# Patient Record
Sex: Male | Born: 1950 | Race: Black or African American | Hispanic: No | Marital: Married | Smoking: Never smoker
Health system: Southern US, Community
[De-identification: ages and names within clinical notes are randomized; demographics above are authoritative.]

## PROBLEM LIST (undated history)

## (undated) DIAGNOSIS — I2510 Atherosclerotic heart disease of native coronary artery without angina pectoris: Secondary | ICD-10-CM

## (undated) DIAGNOSIS — I10 Essential (primary) hypertension: Secondary | ICD-10-CM

## (undated) DIAGNOSIS — E785 Hyperlipidemia, unspecified: Secondary | ICD-10-CM

## (undated) HISTORY — PX: HERNIA REPAIR: SHX51

## (undated) HISTORY — PX: CORONARY ANGIOPLASTY WITH STENT PLACEMENT: SHX49

## (undated) HISTORY — DX: Hyperlipidemia, unspecified: E78.5

## (undated) HISTORY — PX: CARDIAC CATHETERIZATION: SHX172

---
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
## 2016-07-19 ENCOUNTER — Ambulatory Visit
Admission: RE | Admit: 2016-07-19 | Discharge: 2016-07-19 | Disposition: A | Discharge status: Home / Self Care | Payer: 59 | Source: Ambulatory Visit | Attending: Otolaryngology | Admitting: Otolaryngology

## 2016-07-19 ENCOUNTER — Other Ambulatory Visit: Payer: Self-pay | Admitting: Otolaryngology

## 2016-07-19 DIAGNOSIS — L0201 Cutaneous abscess of face: Secondary | ICD-10-CM

## 2016-07-19 MED ORDER — IOPAMIDOL (ISOVUE-300) INJECTION 61%
75.0000 mL | Freq: Once | INTRAVENOUS | Status: AC | PRN
  Administered 2016-07-19: 75 mL via INTRAVENOUS

## 2017-02-14 ENCOUNTER — Ambulatory Visit
Admission: RE | Admit: 2017-02-14 | Discharge: 2017-02-14 | Disposition: A | Discharge status: Home / Self Care | Payer: 59 | Source: Ambulatory Visit | Attending: Family Medicine | Admitting: Family Medicine

## 2017-02-14 ENCOUNTER — Other Ambulatory Visit: Payer: Self-pay | Admitting: Family Medicine

## 2017-02-14 DIAGNOSIS — M7989 Other specified soft tissue disorders: Secondary | ICD-10-CM

## 2019-01-03 ENCOUNTER — Encounter: Payer: Self-pay | Admitting: Cardiology

## 2019-01-04 ENCOUNTER — Other Ambulatory Visit: Payer: Self-pay

## 2019-01-04 ENCOUNTER — Telehealth: Payer: Self-pay

## 2019-01-04 ENCOUNTER — Encounter: Payer: Self-pay | Admitting: Cardiology

## 2019-01-04 ENCOUNTER — Ambulatory Visit: Payer: 59 | Admitting: Cardiology

## 2019-01-04 VITALS — BP 140/94 | HR 62 | Ht 68.0 in | Wt 199.0 lb

## 2019-01-04 DIAGNOSIS — I10 Essential (primary) hypertension: Secondary | ICD-10-CM | POA: Diagnosis not present

## 2019-01-04 DIAGNOSIS — E785 Hyperlipidemia, unspecified: Secondary | ICD-10-CM

## 2019-01-04 DIAGNOSIS — R0789 Other chest pain: Secondary | ICD-10-CM | POA: Diagnosis not present

## 2019-01-04 DIAGNOSIS — R9431 Abnormal electrocardiogram [ECG] [EKG]: Secondary | ICD-10-CM | POA: Diagnosis not present

## 2019-01-04 HISTORY — DX: Essential (primary) hypertension: I10

## 2019-01-04 MED ORDER — LOSARTAN POTASSIUM 50 MG PO TABS: 100.0000 mg | ORAL_TABLET | Freq: Every day | ORAL | 3 refills | Status: DC

## 2019-01-04 MED ORDER — HYDROCHLOROTHIAZIDE 25 MG PO TABS: 25.0000 mg | ORAL_TABLET | Freq: Every day | ORAL | 0 refills | Status: DC

## 2019-01-04 MED ORDER — LOSARTAN POTASSIUM-HCTZ 100-25 MG PO TABS: 1.0000 | ORAL_TABLET | Freq: Every day | ORAL | 1 refills | Status: DC

## 2019-01-04 NOTE — Telephone Encounter (Signed)
Pt called and said that he does have a 90 days supply left of Losartan?

## 2019-01-04 NOTE — Progress Notes (Signed)
Primary Physician:  [name], MD   Patient ID: Bradley Parker, male    DOB: 07/16/1950, 68 y.o.   MRN: [id number]  Subjective:    Chief Complaint  Patient presents with  . Chest Pain  . New Patient (Initial Visit)    HPI: Bradley Parker  is a 68 y.o. male  with hypertension, preglaucoma, Vitamin D deficiency, referred to [place] for chest pain by Dr. Jacelyn Grip.  He has noticed over the last few months, he has had episodes where he feels chest tightness and anxious feeling in his chest. Not associated with exertion. No associated pain radiation or shortness of breath. Episodes can last for a few minutes. Last episode was one week ago. No palpitations or heart racing. No leg swelling. Does admit to snoring, but mostly wakes up feeling refreshed.   Reports that he he has had hypertension for many years and has been fairly well controlled. He has had mild hyperlipidemia. No history of diabetes.   No former tobacco use. Rare alcohol use. No illicit drug use. He is fairly active, he push mows his yard that he states is a decent size lot.   He is retired and lives with his wife. Has one daughter that lives in [place].   Past Medical History:  Diagnosis Date  . HTN (hypertension) 01/04/2019  . Hyperlipidemia     Past Surgical History:  Procedure Laterality Date  . HERNIA REPAIR      Social History   Socioeconomic History  . Marital status: Married    Spouse name: Not on file  . Number of children: 1  . Years of education: Not on file  . Highest education level: Not on file  Occupational History  . Not on file  Social Needs  . Financial resource strain: Not on file  . Food insecurity    Worry: Not on file    Inability: Not on file  . Transportation needs    Medical: Not on file    Non-medical: Not on file  Tobacco Use  . Smoking status: Never Smoker  . Smokeless tobacco: Never Used  Substance and Sexual Activity  . Alcohol use: Not Currently    Frequency: Never  . Drug  use: Never  . Sexual activity: Not on file  Lifestyle  . Physical activity    Days per week: Not on file    Minutes per session: Not on file  . Stress: Not on file  Relationships  . Social Herbalist on phone: Not on file    Gets together: Not on file    Attends religious service: Not on file    Active member of club or organization: Not on file    Attends meetings of clubs or organizations: Not on file    Relationship status: Not on file  . Intimate partner violence    Fear of current or ex partner: Not on file    Emotionally abused: Not on file    Physically abused: Not on file    Forced sexual activity: Not on file  Other Topics Concern  . Not on file  Social History Narrative  . Not on file    Review of Systems  Constitution: Negative for decreased appetite, malaise/fatigue, weight gain and weight loss.  Eyes: Negative for visual disturbance.  Cardiovascular: Positive for chest pain. Negative for claudication, dyspnea on exertion, leg swelling, orthopnea, palpitations and syncope.  Respiratory: Negative for hemoptysis and wheezing.   Endocrine: Negative for  cold intolerance and heat intolerance.  Hematologic/Lymphatic: Does not bruise/bleed easily.  Skin: Negative for nail changes.  Musculoskeletal: Negative for muscle weakness and myalgias.  Gastrointestinal: Negative for abdominal pain, change in bowel habit, nausea and vomiting.  Neurological: Negative for difficulty with concentration, dizziness, focal weakness and headaches.  Psychiatric/Behavioral: Negative for altered mental status and suicidal ideas.  All other systems reviewed and are negative.     Objective:  Blood pressure (!) 140/94, pulse 62, height '5\' 8"'  (1.727 m), weight 199 lb (90.3 kg), SpO2 97 %. Body mass index is 30.26 kg/m.    Physical Exam  Constitutional: He is oriented to person, place, and time. Vital signs are normal. He appears well-developed and well-nourished.  HENT:  Head:  Normocephalic and atraumatic.  Neck: Normal range of motion.  Cardiovascular: Normal rate, regular rhythm, normal heart sounds and intact distal pulses.  Pulses:      Dorsalis pedis pulses are 2+ on the right side and 2+ on the left side.       Posterior tibial pulses are 2+ on the right side and 2+ on the left side.  Ejection click at ULSB  Pulmonary/Chest: Effort normal and breath sounds normal. No accessory muscle usage. No respiratory distress.  Abdominal: Soft. Bowel sounds are normal.  Musculoskeletal: Normal range of motion.  Neurological: He is alert and oriented to person, place, and time.  Skin: Skin is warm and dry.  Vitals reviewed.  Radiology: No results found.  Laboratory examination:   12/25/2018: TSH 1.42.  Creatinine 1.3 EGFR 54/66, potassium 4.8, CMP otherwise normal.  Vitamin D 30.  Cholesterol 189, triglycerides 70, HDL 44, LDL 131.  No flowsheet data found. No flowsheet data found. Lipid Panel  No results found for: CHOL, TRIG, HDL, CHOLHDL, VLDL, LDLCALC, LDLDIRECT HEMOGLOBIN A1C No results found for: HGBA1C, MPG TSH No results for input(s): TSH in the last 8760 hours.  PRN Meds:. Medications Discontinued During This Encounter  Medication Reason  . loratadine (CLARITIN) 10 MG tablet Error   Current Meds  Medication Sig  . aspirin EC 81 MG tablet Take 1 tablet by mouth daily.  Marland Kitchen dutasteride (AVODART) 0.5 MG capsule Take 1 capsule by mouth 3 (three) times daily.  Marland Kitchen Fexofenadine HCl (ALLERGY 24-HR PO) Take by mouth daily.  Marland Kitchen latanoprost (XALATAN) 0.005 % ophthalmic solution Place 1 drop into both eyes at bedtime.  Marland Kitchen losartan (COZAAR) 50 MG tablet Take 1 tablet by mouth daily.  . timolol (TIMOPTIC) 0.5 % ophthalmic solution Place 1 drop into both eyes daily.  . Vitamin D, Cholecalciferol, 50 MCG (2000 UT) CAPS Take 1 capsule by mouth daily.  . [DISCONTINUED] loratadine (CLARITIN) 10 MG tablet Take 10 mg by mouth daily.    Cardiac Studies:      Assessment:     ICD-10-CM   1. Atypical chest pain  R07.89 EKG 12-Lead  2. Abnormal EKG  R94.31   3. Essential hypertension  I10     EKG 01/04/2019: Normal sinus rhythm at 61 bpm, normal axis, T wave inversion/ flattening in inferior and anterolateral leads, cannot exclude ischemia. Abnormal EKG  Recommendations:   Mr. Galdamez is a pleasant 68 year old male with hypertension, preglaucoma, Vitamin D deficiency, referred to [place] for chest pain by Dr. Jacelyn Grip.  Patient's symptoms of chest pain not associated with exertion are atypical.  He does have an ejection click on examination, otherwise physical exam is unremarkable.  In view of hypertension, mild hyperlipidemia, and age, and symptoms feel that  he should be further evaluated with Lexiscan nuclear stress testing.  He also has EKG abnormalities on today's EKG compared to PCP EKG 2 weeks ago.  We'll obtain echocardiogram to exclude any structural abnormalities.  Suspect EKG abnormalities are related to hypertension.  I will change losartan to losartan hydrochlorthiazide100-12.5 mg daily.  I discussed importance of controlling his cholesterol with diet and potentially statin therapy.  He wishes to try diet changes first and reevaluate before starting medical therapy.  I am agreeable to this, unless testing is abnormal.  I'll see him back after the test for further recommendations and reevaluation.   *I have discussed this case with Dr. Einar Gip and he personally examined the patient and participated in formulating the plan.*   Miquel Dunn, MSN, APRN, FNP-C [facility]. [place] Office: [phone] Fax: [phone]

## 2019-01-21 ENCOUNTER — Ambulatory Visit (INDEPENDENT_AMBULATORY_CARE_PROVIDER_SITE_OTHER): Payer: Medicare Other

## 2019-01-21 ENCOUNTER — Other Ambulatory Visit: Payer: Self-pay

## 2019-01-21 DIAGNOSIS — R0789 Other chest pain: Secondary | ICD-10-CM

## 2019-01-21 DIAGNOSIS — R9431 Abnormal electrocardiogram [ECG] [EKG]: Secondary | ICD-10-CM | POA: Diagnosis not present

## 2019-01-23 ENCOUNTER — Other Ambulatory Visit: Payer: Self-pay

## 2019-01-23 ENCOUNTER — Ambulatory Visit (INDEPENDENT_AMBULATORY_CARE_PROVIDER_SITE_OTHER): Payer: Medicare Other

## 2019-01-23 DIAGNOSIS — R9431 Abnormal electrocardiogram [ECG] [EKG]: Secondary | ICD-10-CM | POA: Diagnosis not present

## 2019-01-23 DIAGNOSIS — R0789 Other chest pain: Secondary | ICD-10-CM

## 2019-01-29 HISTORY — PX: CATARACT EXTRACTION: SUR2

## 2019-01-31 ENCOUNTER — Other Ambulatory Visit: Payer: Self-pay

## 2019-01-31 ENCOUNTER — Ambulatory Visit (INDEPENDENT_AMBULATORY_CARE_PROVIDER_SITE_OTHER): Payer: Medicare Other | Admitting: Cardiology

## 2019-01-31 ENCOUNTER — Encounter: Payer: Self-pay | Admitting: Cardiology

## 2019-01-31 VITALS — BP 133/96 | HR 73 | Ht 68.0 in | Wt 193.0 lb

## 2019-01-31 DIAGNOSIS — I10 Essential (primary) hypertension: Secondary | ICD-10-CM | POA: Diagnosis not present

## 2019-01-31 DIAGNOSIS — E785 Hyperlipidemia, unspecified: Secondary | ICD-10-CM

## 2019-01-31 DIAGNOSIS — R9439 Abnormal result of other cardiovascular function study: Secondary | ICD-10-CM

## 2019-01-31 DIAGNOSIS — R9431 Abnormal electrocardiogram [ECG] [EKG]: Secondary | ICD-10-CM | POA: Diagnosis not present

## 2019-01-31 DIAGNOSIS — R0789 Other chest pain: Secondary | ICD-10-CM

## 2019-01-31 MED ORDER — ROSUVASTATIN CALCIUM 10 MG PO TABS: 10.0000 mg | ORAL_TABLET | Freq: Every day | ORAL | 1 refills | Status: DC

## 2019-01-31 NOTE — Progress Notes (Signed)
Primary Physician:  Bradley Shanks, MD   Patient ID: Bradley Parker, male    DOB: [birth year], 68 y.o.   MRN: [id number]  Subjective:    Chief Complaint  Patient presents with  . Chest Pain  . Abnormal ECG  . Results  . Follow-up   This visit type was conducted due to national recommendations for restrictions regarding the COVID-19 Pandemic (e.g. social distancing).  This format is felt to be most appropriate for this patient at this time.  All issues noted in this document were discussed and addressed.  No physical exam was performed (except for noted visual exam findings with Telehealth visits).  The patient has consented to conduct a Telehealth visit and understands insurance will be billed.   I discussed the limitations of evaluation and management by telemedicine and the availability of in person appointments. The patient expressed understanding and agreed to proceed.  Virtual Visit via Video Note is as below  I connected with Mr. [name], on 01/31/19 at 1145 by a video enabled telemedicine application and verified that I am speaking with the correct person using two identifiers.     I have discussed with her regarding the safety during COVID Pandemic and steps and precautions including social distancing with the patient.    HPI: Bradley Parker  is a 68 y.o. male  with hypertension, preglaucoma, Vitamin D deficiency, referred to [place] for chest pain by Dr. Jacelyn Grip.  He has noticed over the last few months, he has had episodes where he feels chest tightness and anxious feeling in his chest. Not associated with exertion. No associated pain radiation or shortness of breath. Episodes can last for a few minutes. Last episode was one week ago. No palpitations or heart racing. No leg swelling. Does admit to snoring, but mostly wakes up feeling refreshed.   Reports that he he has had hypertension for many years and has been fairly well controlled. He has had mild hyperlipidemia. No history of  diabetes.   No former tobacco use. Rare alcohol use. No illicit drug use. He is fairly active, he push mows his yard that he states is a decent size lot.   He is retired and lives with his wife. Has one daughter that lives in [place].   Past Medical History:  Diagnosis Date  . HTN (hypertension) 01/04/2019  . Hyperlipidemia     Past Surgical History:  Procedure Laterality Date  . CATARACT EXTRACTION Left 01/29/2019  . HERNIA REPAIR      Social History   Socioeconomic History  . Marital status: Married    Spouse name: Not on file  . Number of children: 1  . Years of education: Not on file  . Highest education level: Not on file  Occupational History  . Not on file  Social Needs  . Financial resource strain: Not on file  . Food insecurity    Worry: Not on file    Inability: Not on file  . Transportation needs    Medical: Not on file    Non-medical: Not on file  Tobacco Use  . Smoking status: Never Smoker  . Smokeless tobacco: Never Used  Substance and Sexual Activity  . Alcohol use: Not Currently    Frequency: Never  . Drug use: Never  . Sexual activity: Not on file  Lifestyle  . Physical activity    Days per week: Not on file    Minutes per session: Not on file  . Stress: Not on file  Relationships  . Social Herbalist on phone: Not on file    Gets together: Not on file    Attends religious service: Not on file    Active member of club or organization: Not on file    Attends meetings of clubs or organizations: Not on file    Relationship status: Not on file  . Intimate partner violence    Fear of current or ex partner: Not on file    Emotionally abused: Not on file    Physically abused: Not on file    Forced sexual activity: Not on file  Other Topics Concern  . Not on file  Social History Narrative  . Not on file    Review of Systems  Constitution: Negative for decreased appetite, malaise/fatigue, weight gain and weight loss.  Eyes: Negative  for visual disturbance.  Cardiovascular: Positive for chest pain. Negative for claudication, dyspnea on exertion, leg swelling, orthopnea, palpitations and syncope.  Respiratory: Negative for hemoptysis and wheezing.   Endocrine: Negative for cold intolerance and heat intolerance.  Hematologic/Lymphatic: Does not bruise/bleed easily.  Skin: Negative for nail changes.  Musculoskeletal: Negative for muscle weakness and myalgias.  Gastrointestinal: Negative for abdominal pain, change in bowel habit, nausea and vomiting.  Neurological: Negative for difficulty with concentration, dizziness, focal weakness and headaches.  Psychiatric/Behavioral: Negative for altered mental status and suicidal ideas.  All other systems reviewed and are negative.     Objective:  Blood pressure (!) 133/96, pulse 73, height _0  (1.727 m), weight 193 lb (87.5 kg). Body mass index is 29.35 kg/m.    Physical Exam  Constitutional: He is oriented to person, place, and time. Vital signs are normal. He appears well-developed and well-nourished.  HENT:  Head: Normocephalic and atraumatic.  Neck: Normal range of motion.  Cardiovascular: Normal rate, regular rhythm, normal heart sounds and intact distal pulses.  Pulses:      Dorsalis pedis pulses are 2+ on the right side and 2+ on the left side.       Posterior tibial pulses are 2+ on the right side and 2+ on the left side.  Ejection click at ULSB  Pulmonary/Chest: Effort normal and breath sounds normal. No accessory muscle usage. No respiratory distress.  Abdominal: Soft. Bowel sounds are normal.  Musculoskeletal: Normal range of motion.  Neurological: He is alert and oriented to person, place, and time.  Skin: Skin is warm and dry.  Vitals reviewed.  Radiology: No results found.  Laboratory examination:   12/25/2018: TSH 1.42.  Creatinine 1.3 EGFR 54/66, potassium 4.8, CMP otherwise normal.  Vitamin D 30.  Cholesterol 189, triglycerides 70, HDL 44, LDL 131.   No flowsheet data found. No flowsheet data found. Lipid Panel  No results found for: CHOL, TRIG, HDL, CHOLHDL, VLDL, LDLCALC, LDLDIRECT HEMOGLOBIN A1C No results found for: HGBA1C, MPG TSH No results for input(s): TSH in the last 8760 hours.  PRN Meds:. There are no discontinued medications. Current Meds  Medication Sig  . aspirin EC 81 MG tablet Take 1 tablet by mouth daily.  . DUREZOL 0.05 % EMUL PLACE 1 DROP IN LEFT EYE 4 TIMES DAILY START 2 DAYS PRIOR TO SURGERY  . dutasteride (AVODART) 0.5 MG capsule Take 1 capsule by mouth daily.   Marland Kitchen Fexofenadine HCl (ALLERGY 24-HR PO) Take by mouth daily.  Marland Kitchen gatifloxacin (ZYMAXID) 0.5 % SOLN PLACE 1 DROP IN LEFT EYE 4 TIMES A DAY START 2 DAYS PRIOR TO SURGERY & 2 DROPS THE  AM OF SURGERY  . latanoprost (XALATAN) 0.005 % ophthalmic solution Place 1 drop into both eyes at bedtime.  Marland Kitchen losartan (COZAAR) 50 MG tablet Take 2 tablets (100 mg total) by mouth daily.  . timolol (TIMOPTIC) 0.5 % ophthalmic solution Place 1 drop into both eyes daily.  . Vitamin D, Cholecalciferol, 25 MCG (1000 UT) TABS Take 1 capsule by mouth daily.     Cardiac Studies:   Lexiscan Sestamibi Stress Test 01/21/2019: Resting EKG months normal sinus rhythm, poor R-wave progression, nonspecific diffuse T abnormality.  Stress EKG non-diagnostic due to pharmacologic stress testing, There was pseudo-normalization of T inversion. No symptoms were experienced. Perfusion imaging study demonstrates a moderate-sized moderate ischemia involving the anteroseptal and apical anterolateral ischemia extending from the mid ventricle.  Left ventricle systolic function Related by QGS was mildly depressed at 44% with anterior hypokinesis.  High risk study.  Echo- 01/23/2019 1. Normal LV systolic function with EF 58%. Left ventricle cavity is normal in size. Moderate concentric hypertrophy of the left ventricle. Normal global wall motion. Doppler evidence of grade I (impaired) diastolic  dysfunction. Calculated EF 58%. 2. Mild (Grade I) mitral regurgitation. 3. Mild tricuspid regurgitation. No evidence of pulmonary hypertension.  Assessment:     ICD-10-CM   1. Abnormal nuclear stress test  R94.39   2. Atypical chest pain  R07.89   3. Abnormal EKG  R94.31   4. Essential hypertension  I10   5. Mild hyperlipidemia  E78.5     EKG 01/04/2019: Normal sinus rhythm at 61 bpm, normal axis, T wave inversion/ flattening in inferior and anterolateral leads, cannot exclude ischemia. Abnormal EKG  Recommendations:   Mr. Veasey is a pleasant 68 year old male with hypertension, preglaucoma, Vitamin D deficiency, recently evaluated by [place] for abnormal EKG and atypical chest pain.   Fortunately since last seen by [place], fortunately he has not had an episode of chest pain.  He has been doing his normal activities.  I discussed recently obtained stress test and echocardiogram results with the patient.  He has moderate size perfusion abnormality in anterolateral consistent with EKG abnormalities.  Given his risk factors, high risk nuclear stress test, occasional chest pain, feel that his best option would be to pursue coronary angiogram for further evaluation.  He is currently on aspirin, have encouraged him to continue the same. Schedule for cardiac catheterization, and possible angioplasty. We discussed regarding risks, benefits, alternatives to this including stress testing, CTA and continued medical therapy. Patient wants to proceed. Understands <1-2% risk of death, stroke, MI, urgent CABG, bleeding, infection, renal failure but not limited to these.  He was started on higher dose of losartan at his last office visit, blood pressure continues to be elevated.  He will start hydrochlorothiazide 25 mg daily.  I will also start him on Crestor 10 mg daily for hyperlipidemia and also in view of abnormal stress test.  Echocardiogram was without structural abnormalities, except for blood pressure changes  with moderate LVH and grade 1 diastolic dysfunction.  Normal LVEF.  I have encouraged him to not push himself until he can have further work-up, but he can continue to do his day-to-day activities as tolerated.  I will see him back after the procedure for follow-up. Wife ws present for our visit today, all questions were answered.   Miquel Dunn, MSN, APRN, FNP-C [facility]. [place] Office: [phone] Fax: [phone]

## 2019-02-03 ENCOUNTER — Encounter: Payer: Self-pay | Admitting: Cardiology

## 2019-02-23 ENCOUNTER — Other Ambulatory Visit: Payer: Self-pay | Admitting: Cardiology

## 2019-02-25 ENCOUNTER — Telehealth: Payer: Self-pay

## 2019-02-25 ENCOUNTER — Other Ambulatory Visit: Payer: Self-pay | Admitting: Cardiology

## 2019-02-25 MED ORDER — LOSARTAN POTASSIUM-HCTZ 100-25 MG PO TABS: 1.0000 | ORAL_TABLET | Freq: Every day | ORAL | 2 refills | Status: DC

## 2019-02-25 NOTE — Telephone Encounter (Signed)
Per [employer]

## 2019-02-25 NOTE — Telephone Encounter (Signed)
Refill on Losartan HCT was given. Will proceed with cath, will have front staff contact patient to schedule.

## 2019-02-25 NOTE — Telephone Encounter (Signed)
Bradley Parker could you please call this pt, due to his las visit with you thank you

## 2019-02-26 ENCOUNTER — Other Ambulatory Visit: Payer: Self-pay | Admitting: Cardiology

## 2019-02-26 DIAGNOSIS — R9439 Abnormal result of other cardiovascular function study: Secondary | ICD-10-CM

## 2019-02-26 NOTE — Addendum Note (Signed)
Addended by: Gwinda Maine on: 02/26/2019 12:01 PM   Modules accepted: Orders

## 2019-03-02 LAB — CBC
Hematocrit: 42.2 % (ref 37.5–51.0)
Hemoglobin: 13.5 g/dL (ref 13.0–17.7)
MCH: 28.6 pg (ref 26.6–33.0)
MCHC: 32 g/dL (ref 31.5–35.7)
MCV: 89 fL (ref 79–97)
Platelets: 131 10*3/uL — ABNORMAL LOW (ref 150–450)
RBC: 4.72 x10E6/uL (ref 4.14–5.80)
RDW: 11.3 % — ABNORMAL LOW (ref 11.6–15.4)
WBC: 5.5 10*3/uL (ref 3.4–10.8)

## 2019-03-02 LAB — BASIC METABOLIC PANEL
BUN/Creatinine Ratio: 13 (ref 10–24)
BUN: 19 mg/dL (ref 8–27)
CO2: 23 mmol/L (ref 20–29)
Calcium: 9.6 mg/dL (ref 8.6–10.2)
Chloride: 106 mmol/L (ref 96–106)
Creatinine, Ser: 1.41 mg/dL — ABNORMAL HIGH (ref 0.76–1.27)
GFR calc Af Amer: 60 mL/min/{1.73_m2} (ref >59)
GFR calc non Af Amer: 52 mL/min/{1.73_m2} — ABNORMAL LOW (ref >59)
Glucose: 117 mg/dL — ABNORMAL HIGH (ref 65–99)
Potassium: 4.5 mmol/L (ref 3.5–5.2)
Sodium: 142 mmol/L (ref 134–144)

## 2019-03-06 DIAGNOSIS — R9439 Abnormal result of other cardiovascular function study: Secondary | ICD-10-CM | POA: Diagnosis present

## 2019-03-06 DIAGNOSIS — R079 Chest pain, unspecified: Secondary | ICD-10-CM | POA: Diagnosis present

## 2019-03-08 ENCOUNTER — Other Ambulatory Visit (HOSPITAL_COMMUNITY)
Admission: RE | Admit: 2019-03-08 | Discharge: 2019-03-08 | Disposition: A | Discharge status: Home / Self Care | Payer: Medicare Other | Source: Ambulatory Visit | Attending: Cardiology | Admitting: Cardiology

## 2019-03-08 ENCOUNTER — Other Ambulatory Visit: Payer: Self-pay

## 2019-03-08 DIAGNOSIS — Z20828 Contact with and (suspected) exposure to other viral communicable diseases: Secondary | ICD-10-CM | POA: Insufficient documentation

## 2019-03-08 DIAGNOSIS — Z01812 Encounter for preprocedural laboratory examination: Secondary | ICD-10-CM | POA: Insufficient documentation

## 2019-03-09 LAB — NOVEL CORONAVIRUS, NAA (HOSP ORDER, SEND-OUT TO REF LAB; TAT 18-24 HRS): SARS-CoV-2, NAA: NOT DETECTED

## 2019-03-12 ENCOUNTER — Ambulatory Visit (HOSPITAL_COMMUNITY)
Admission: RE | Admit: 2019-03-12 | Discharge: 2019-03-12 | Disposition: A | Discharge status: Home / Self Care | Payer: Medicare Other | Attending: Cardiology | Admitting: Cardiology

## 2019-03-12 ENCOUNTER — Other Ambulatory Visit: Payer: Self-pay

## 2019-03-12 ENCOUNTER — Encounter (HOSPITAL_COMMUNITY)
Admission: RE | Disposition: A | Discharge status: Home / Self Care | Payer: Self-pay | Source: Home / Self Care | Attending: Cardiology

## 2019-03-12 DIAGNOSIS — I209 Angina pectoris, unspecified: Secondary | ICD-10-CM

## 2019-03-12 DIAGNOSIS — Z7982 Long term (current) use of aspirin: Secondary | ICD-10-CM | POA: Diagnosis not present

## 2019-03-12 DIAGNOSIS — R9431 Abnormal electrocardiogram [ECG] [EKG]: Secondary | ICD-10-CM | POA: Insufficient documentation

## 2019-03-12 DIAGNOSIS — I25118 Atherosclerotic heart disease of native coronary artery with other forms of angina pectoris: Secondary | ICD-10-CM

## 2019-03-12 DIAGNOSIS — I10 Essential (primary) hypertension: Secondary | ICD-10-CM | POA: Diagnosis not present

## 2019-03-12 DIAGNOSIS — E785 Hyperlipidemia, unspecified: Secondary | ICD-10-CM | POA: Diagnosis not present

## 2019-03-12 DIAGNOSIS — Z955 Presence of coronary angioplasty implant and graft: Secondary | ICD-10-CM | POA: Diagnosis not present

## 2019-03-12 DIAGNOSIS — Z79899 Other long term (current) drug therapy: Secondary | ICD-10-CM | POA: Insufficient documentation

## 2019-03-12 DIAGNOSIS — R9439 Abnormal result of other cardiovascular function study: Secondary | ICD-10-CM | POA: Diagnosis present

## 2019-03-12 DIAGNOSIS — E559 Vitamin D deficiency, unspecified: Secondary | ICD-10-CM | POA: Diagnosis not present

## 2019-03-12 DIAGNOSIS — Z95820 Peripheral vascular angioplasty status with implants and grafts: Secondary | ICD-10-CM

## 2019-03-12 DIAGNOSIS — R079 Chest pain, unspecified: Secondary | ICD-10-CM | POA: Diagnosis present

## 2019-03-12 HISTORY — PX: LEFT HEART CATH AND CORONARY ANGIOGRAPHY: CATH118249

## 2019-03-12 HISTORY — PX: CORONARY STENT INTERVENTION: CATH118234

## 2019-03-12 LAB — POCT ACTIVATED CLOTTING TIME
Activated Clotting Time: 279 seconds
Activated Clotting Time: 290 seconds

## 2019-03-12 SURGERY — LEFT HEART CATH AND CORONARY ANGIOGRAPHY
Anesthesia: LOCAL

## 2019-03-12 MED ORDER — ACETAMINOPHEN 325 MG PO TABS: 650.0000 mg | ORAL_TABLET | ORAL | Status: DC | PRN

## 2019-03-12 MED ORDER — SODIUM CHLORIDE 0.9 % WEIGHT BASED INFUSION: 1.0000 mL/kg/h | INTRAVENOUS | Status: DC

## 2019-03-12 MED ORDER — NITROGLYCERIN 1 MG/10 ML FOR IR/CATH LAB
INTRA_ARTERIAL | Status: AC
  Filled 2019-03-12: qty 10

## 2019-03-12 MED ORDER — IOHEXOL 350 MG/ML SOLN
INTRAVENOUS | Status: DC | PRN
  Administered 2019-03-12: 115 mL via INTRA_ARTERIAL

## 2019-03-12 MED ORDER — SODIUM CHLORIDE 0.9% FLUSH: 3.0000 mL | INTRAVENOUS | Status: DC | PRN

## 2019-03-12 MED ORDER — METOPROLOL SUCCINATE ER 25 MG PO TB24: 25.0000 mg | ORAL_TABLET | Freq: Every day | ORAL | 11 refills | Status: DC

## 2019-03-12 MED ORDER — FENTANYL CITRATE (PF) 100 MCG/2ML IJ SOLN
INTRAMUSCULAR | Status: DC | PRN
  Administered 2019-03-12: 25 ug via INTRAVENOUS

## 2019-03-12 MED ORDER — HEPARIN SODIUM (PORCINE) 1000 UNIT/ML IJ SOLN
INTRAMUSCULAR | Status: AC
  Filled 2019-03-12: qty 1

## 2019-03-12 MED ORDER — MIDAZOLAM HCL 2 MG/2ML IJ SOLN
INTRAMUSCULAR | Status: DC | PRN
  Administered 2019-03-12: 1 mg via INTRAVENOUS

## 2019-03-12 MED ORDER — ROSUVASTATIN CALCIUM 20 MG PO TABS: 20.0000 mg | ORAL_TABLET | Freq: Every day | ORAL | 1 refills | Status: DC

## 2019-03-12 MED ORDER — CLOPIDOGREL BISULFATE 300 MG PO TABS
ORAL_TABLET | ORAL | Status: AC
  Filled 2019-03-12: qty 2

## 2019-03-12 MED ORDER — CLOPIDOGREL BISULFATE 300 MG PO TABS
ORAL_TABLET | ORAL | Status: DC | PRN
  Administered 2019-03-12: 600 mg via ORAL

## 2019-03-12 MED ORDER — LIDOCAINE HCL (PF) 1 % IJ SOLN
INTRAMUSCULAR | Status: AC
  Filled 2019-03-12: qty 30

## 2019-03-12 MED ORDER — LIDOCAINE HCL (PF) 1 % IJ SOLN
INTRAMUSCULAR | Status: DC | PRN
  Administered 2019-03-12: 4 mL via INTRADERMAL

## 2019-03-12 MED ORDER — CLOPIDOGREL BISULFATE 75 MG PO TABS: 75.0000 mg | ORAL_TABLET | Freq: Every day | ORAL | 0 refills | Status: DC

## 2019-03-12 MED ORDER — SODIUM CHLORIDE 0.9 % WEIGHT BASED INFUSION: 1.0000 mL/kg/h | INTRAVENOUS | Status: AC

## 2019-03-12 MED ORDER — ONDANSETRON HCL 4 MG/2ML IJ SOLN: 4.0000 mg | Freq: Four times a day (QID) | INTRAMUSCULAR | Status: DC | PRN

## 2019-03-12 MED ORDER — CLOPIDOGREL BISULFATE 75 MG PO TABS: 75.0000 mg | ORAL_TABLET | Freq: Every day | ORAL | Status: DC

## 2019-03-12 MED ORDER — VERAPAMIL HCL 2.5 MG/ML IV SOLN
INTRAVENOUS | Status: DC | PRN
  Administered 2019-03-12: 10 mL via INTRA_ARTERIAL

## 2019-03-12 MED ORDER — HEPARIN (PORCINE) IN NACL 1000-0.9 UT/500ML-% IV SOLN
INTRAVENOUS | Status: AC
  Filled 2019-03-12: qty 1000

## 2019-03-12 MED ORDER — HEPARIN SODIUM (PORCINE) 1000 UNIT/ML IJ SOLN
INTRAMUSCULAR | Status: DC | PRN
  Administered 2019-03-12: 4000 [IU] via INTRAVENOUS
  Administered 2019-03-12: 2000 [IU] via INTRAVENOUS
  Administered 2019-03-12: 4500 [IU] via INTRAVENOUS

## 2019-03-12 MED ORDER — VERAPAMIL HCL 2.5 MG/ML IV SOLN
INTRAVENOUS | Status: AC
  Filled 2019-03-12: qty 2

## 2019-03-12 MED ORDER — NITROGLYCERIN 0.4 MG SL SUBL: 0.4000 mg | SUBLINGUAL_TABLET | SUBLINGUAL | 3 refills | Status: DC | PRN

## 2019-03-12 MED ORDER — SODIUM CHLORIDE 0.9 % IV SOLN: 250.0000 mL | INTRAVENOUS | Status: DC | PRN

## 2019-03-12 MED ORDER — HEPARIN (PORCINE) IN NACL 1000-0.9 UT/500ML-% IV SOLN
INTRAVENOUS | Status: DC | PRN
  Administered 2019-03-12 (×2): 500 mL

## 2019-03-12 MED ORDER — SODIUM CHLORIDE 0.9 % WEIGHT BASED INFUSION
3.0000 mL/kg/h | INTRAVENOUS | Status: AC
  Administered 2019-03-12: 3 mL/kg/h via INTRAVENOUS

## 2019-03-12 MED ORDER — SODIUM CHLORIDE 0.9% FLUSH: 3.0000 mL | Freq: Two times a day (BID) | INTRAVENOUS | Status: DC

## 2019-03-12 MED ORDER — FENTANYL CITRATE (PF) 100 MCG/2ML IJ SOLN
INTRAMUSCULAR | Status: AC
  Filled 2019-03-12: qty 2

## 2019-03-12 MED ORDER — MIDAZOLAM HCL 2 MG/2ML IJ SOLN
INTRAMUSCULAR | Status: AC
  Filled 2019-03-12: qty 2

## 2019-03-12 MED ORDER — ASPIRIN 81 MG PO CHEW: 81.0000 mg | CHEWABLE_TABLET | ORAL | Status: DC

## 2019-03-12 MED ORDER — NITROGLYCERIN 1 MG/10 ML FOR IR/CATH LAB
INTRA_ARTERIAL | Status: DC | PRN
  Administered 2019-03-12: 200 ug via INTRACORONARY

## 2019-03-12 MED ORDER — SODIUM CHLORIDE 0.9 % WEIGHT BASED INFUSION: 3.0000 mL/kg/h | INTRAVENOUS | Status: DC

## 2019-03-12 MED FILL — CLOPIDOGREL 75 MG TABLET: 75 | 30 days supply | Qty: 30 | Fill #0

## 2019-03-12 MED FILL — NITROGLYCERIN 0.4 MG TAB SL: 0.4 | 8 days supply | Qty: 25 | Fill #0

## 2019-03-12 MED FILL — METOPROLOL SUCCINATE ER 25: 25 | 30 days supply | Qty: 30 | Fill #0

## 2019-03-12 SURGICAL SUPPLY — 17 items
BALLN WOLVERINE 3.50X10 (BALLOONS) ×2
BALLOON WOLVERINE 3.50X10 (BALLOONS) IMPLANT
CATH OPTITORQUE TIG 4.0 5F (CATHETERS) ×1 IMPLANT
CATH VISTA GUIDE 6FR XBLAD3.5 (CATHETERS) ×1 IMPLANT
DEVICE RAD COMP TR BAND LRG (VASCULAR PRODUCTS) ×2 IMPLANT
GLIDESHEATH SLEND A-KIT 6F 22G (SHEATH) ×1 IMPLANT
GUIDEWIRE INQWIRE 1.5J.035X260 (WIRE) IMPLANT
INQWIRE 1.5J .035X260CM (WIRE) ×2
KIT ENCORE 26 ADVANTAGE (KITS) ×1 IMPLANT
KIT HEART LEFT (KITS) ×2 IMPLANT
PACK CARDIAC CATHETERIZATION (CUSTOM PROCEDURE TRAY) ×2 IMPLANT
SHEATH PROBE COVER 6X72 (BAG) ×1 IMPLANT
STENT RESOLUTE ONYX 3.5X18 (Permanent Stent) ×1 IMPLANT
TRANSDUCER W/STOPCOCK (MISCELLANEOUS) ×2 IMPLANT
TUBING CIL FLEX 10 FLL-RA (TUBING) ×2 IMPLANT
WIRE COUGAR XT STRL 190CM (WIRE) ×1 IMPLANT
WIRE HI TORQ VERSACORE-J 145CM (WIRE) ×1 IMPLANT

## 2019-03-12 NOTE — Progress Notes (Signed)
Anderson Malta and Derinda from cath lab here to assess TRB site (right wrist) Area much improved will monitor.

## 2019-03-12 NOTE — Progress Notes (Signed)
 Primary Physician:  Wong, Francis P, MD   Patient ID: Bradley Parker, male    DOB: 07/22/1950, 67 y.o.   MRN: [id number]  Subjective:   CC: Chest pain   HPI: Bradley Parker  is a 67 y.o. male  with hypertension, mild hyperlipidemia, preglaucoma, Vitamin D deficiency, presents for f/u of chest pain. No change in symptoms since we last saw him a month ago. Described as tightness. Sometimes with exertion and at times at rest and lasts a few minutes. No other associated symptoms.   No former tobacco use. Rare alcohol use. No illicit drug use. He is fairly active, he push mows his yard that he states is a decent size lot.  He is retired and lives with his wife.   Past Medical History:  Diagnosis Date  . HTN (hypertension) 01/04/2019  . Hyperlipidemia     Past Surgical History:  Procedure Laterality Date  . CATARACT EXTRACTION Left 01/29/2019  . HERNIA REPAIR      Social History   Socioeconomic History  . Marital status: Married    Spouse name: Not on file  . Number of children: 1  . Years of education: Not on file  . Highest education level: Not on file  Occupational History  . Not on file  Social Needs  . Financial resource strain: Not on file  . Food insecurity    Worry: Not on file    Inability: Not on file  . Transportation needs    Medical: Not on file    Non-medical: Not on file  Tobacco Use  . Smoking status: Never Smoker  . Smokeless tobacco: Never Used  Substance and Sexual Activity  . Alcohol use: Not Currently    Frequency: Never  . Drug use: Never  . Sexual activity: Not on file  Lifestyle  . Physical activity    Days per week: Not on file    Minutes per session: Not on file  . Stress: Not on file  Relationships  . Social connections    Talks on phone: Not on file    Gets together: Not on file    Attends religious service: Not on file    Active member of club or organization: Not on file    Attends meetings of clubs or organizations: Not on file     Relationship status: Not on file  . Intimate partner violence    Fear of current or ex partner: Not on file    Emotionally abused: Not on file    Physically abused: Not on file    Forced sexual activity: Not on file  Other Topics Concern  . Not on file  Social History Narrative  . Not on file    Review of Systems  Constitution: Negative for decreased appetite, malaise/fatigue, weight gain and weight loss.  Eyes: Negative for visual disturbance.  Cardiovascular: Positive for chest pain. Negative for claudication, dyspnea on exertion, leg swelling, orthopnea, palpitations and syncope.  Respiratory: Negative for hemoptysis and wheezing.   Endocrine: Negative for cold intolerance and heat intolerance.  Hematologic/Lymphatic: Does not bruise/bleed easily.  Skin: Negative for nail changes.  Musculoskeletal: Negative for muscle weakness and myalgias.  Gastrointestinal: Negative for abdominal pain, change in bowel habit, nausea and vomiting.  Neurological: Negative for difficulty with concentration, dizziness, focal weakness and headaches.  Psychiatric/Behavioral: Negative for altered mental status and suicidal ideas.  All other systems reviewed and are negative.     Objective:  Blood pressure 132/88, pulse 60, temperature (!)   97.3 F (36.3 C), temperature source Skin, resp. rate 16, height _0  (1.727 m), weight 87.1 kg, SpO2 100 %. Body mass index is 29.19 kg/m.    Physical Exam  Constitutional: He is oriented to person, place, and time. Vital signs are normal. He appears well-developed and well-nourished.  HENT:  Head: Normocephalic and atraumatic.  Neck: Normal range of motion.  Cardiovascular: Normal rate, regular rhythm, normal heart sounds and intact distal pulses.  Pulses:      Dorsalis pedis pulses are 2+ on the right side and 2+ on the left side.       Posterior tibial pulses are 2+ on the right side and 2+ on the left side.  Ejection click at ULSB  Pulmonary/Chest:  Effort normal and breath sounds normal. No accessory muscle usage. No respiratory distress.  Abdominal: Soft. Bowel sounds are normal.  Musculoskeletal: Normal range of motion.  Neurological: He is alert and oriented to person, place, and time.  Skin: Skin is warm and dry.  Vitals reviewed.  Radiology: No results found.  Laboratory examination:   12/25/2018: TSH 1.42.  Creatinine 1.3 EGFR 54/66, potassium 4.8, CMP otherwise normal.  Vitamin D 30.  Cholesterol 189, triglycerides 70, HDL 44, LDL 131.  CMP Latest Ref Rng & Units 03/01/2019  Glucose 65 - 99 mg/dL 117(H)  BUN 8 - 27 mg/dL 19  Creatinine 0.76 - 1.27 mg/dL 1.41(H)  Sodium 134 - 144 mmol/L 142  Potassium 3.5 - 5.2 mmol/L 4.5  Chloride 96 - 106 mmol/L 106  CO2 20 - 29 mmol/L 23  Calcium 8.6 - 10.2 mg/dL 9.6   CBC Latest Ref Rng & Units 03/01/2019  WBC 3.4 - 10.8 x10E3/uL 5.5  Hemoglobin 13.0 - 17.7 g/dL 13.5  Hematocrit 37.5 - 51.0 % 42.2  Platelets 150 - 450 x10E3/uL 131(L)   Lipid Panel  No results found for: CHOL, TRIG, HDL, CHOLHDL, VLDL, LDLCALC, LDLDIRECT HEMOGLOBIN A1C No results found for: HGBA1C, MPG TSH No results for input(s): TSH in the last 8760 hours.  PRN Meds:. Medications Discontinued During This Encounter  Medication Reason  . Fexofenadine HCl (ALLERGY 24-HR PO) Dose change  . 0.9% sodium chloride infusion   . 0.9% sodium chloride infusion    Current Meds  Medication Sig  . aspirin EC 81 MG tablet Take 81 mg by mouth daily.   . diphenhydrAMINE (BENADRYL) 25 MG tablet Take 25 mg by mouth daily.  . DUREZOL 0.05 % EMUL Place 1 drop into the left eye 4 (four) times daily.   Marland Kitchen dutasteride (AVODART) 0.5 MG capsule Take 0.5 mg by mouth daily.   Marland Kitchen gatifloxacin (ZYMAXID) 0.5 % SOLN Place 1 drop into the left eye daily.   Marland Kitchen latanoprost (XALATAN) 0.005 % ophthalmic solution Place 1 drop into both eyes at bedtime.  Marland Kitchen losartan-hydrochlorothiazide (HYZAAR) 100-25 MG tablet Take 1 tablet by mouth daily.  .  rosuvastatin (CRESTOR) 10 MG tablet TAKE 1 TABLET BY MOUTH EVERY DAY (Patient taking differently: Take 10 mg by mouth daily. )  . timolol (TIMOPTIC) 0.5 % ophthalmic solution Place 1 drop into both eyes daily.  . vitamin C (ASCORBIC ACID) 500 MG tablet Take 500 mg by mouth daily.  . Vitamin D, Cholecalciferol, 25 MCG (1000 UT) TABS Take 1,000 Units by mouth daily.     Cardiac Studies:   Lexiscan Sestamibi Stress Test 01/21/2019: Resting EKG months normal sinus rhythm, poor R-wave progression, nonspecific diffuse T abnormality.  Stress EKG non-diagnostic due to pharmacologic stress testing, There  was pseudo-normalization of T inversion. No symptoms were experienced. Perfusion imaging study demonstrates a moderate-sized moderate ischemia involving the anteroseptal and apical anterolateral ischemia extending from the mid ventricle.  Left ventricle systolic function Related by QGS was mildly depressed at 44% with anterior hypokinesis.  High risk study.  Echo- 01/23/2019 1. Normal LV systolic function with EF 58%. Left ventricle cavity is normal in size. Moderate concentric hypertrophy of the left ventricle. Normal global wall motion. Doppler evidence of grade I (impaired) diastolic dysfunction. Calculated EF 58%. 2. Mild (Grade I) mitral regurgitation. 3. Mild tricuspid regurgitation. No evidence of pulmonary hypertension.  Assessment:   1. Chest pain atypical but has high risk stress test suggesting LAD territory ischemia.  EKG 01/04/2019: Normal sinus rhythm at 61 bpm, normal axis, T wave inversion/ flattening in inferior and anterolateral leads, cannot exclude ischemia. Abnormal EKG 2. Primary hypertension 3. Hyperlipidemia  Recommendations:   Fortunately since last seen by [place], fortunately he has not had severe  chest pain, one episode with exertion.  He has been doing his normal activities.  I discussed recently obtained stress test and echocardiogram results with the patient again.   He has moderate size perfusion abnormality in anterolateral consistent with EKG abnormalities.  Given his risk factors, high risk nuclear stress test, occasional chest pain, feel that his best option would be to pursue coronary angiogram for further evaluation.  Patient wants to proceed. Understands <1-2% risk of death, stroke, MI, urgent CABG, bleeding, infection, renal failure but not limited to these.  He is not on a BB, but on statins and ASA and losartan. BP is controlled. Will make recommendations following heart catheterization.   Adrian Prows, MD, [facility] 03/12/2019, 12:08 PM [facility]. [place] Pager: [phone] Office: [phone] If no answer Cell [phone]

## 2019-03-12 NOTE — Progress Notes (Signed)
1405-1442 Education completed with pt who voiced understanding. Stressed importance of plavix with stent. Reviewed NTG use, heart healthy food choices, walking for ex, risk factors, CRP 2. Referred to [place] CRP 2. Pt very interested in referral.  Pt is interested in participating in Virtual Cardiac and Pulmonary Rehab. Pt advised that Virtual Cardiac and Pulmonary Rehab is provided at no cost to the patient.  Checklist:  1. Pt has smart device  ie smartphone and/or ipad for downloading an app  Yes 2. Reliable internet/wifi service    Yes 3. Understands how to use their smartphone and navigate within an app.  Yes   Pt verbalized understanding and is in agreement.

## 2019-03-12 NOTE — H&P (View-Only) (Signed)
Primary Physician:  [name], MD   Patient ID: Bradley Parker, male    DOB: [birth year], 68 y.o.   MRN: [id number]  Subjective:   CC: Chest pain   HPI: Bradley Parker  is a 68 y.o. male  with hypertension, mild hyperlipidemia, preglaucoma, Vitamin D deficiency, presents for f/u of chest pain. No change in symptoms since we last saw him a month ago. Described as tightness. Sometimes with exertion and at times at rest and lasts a few minutes. No other associated symptoms.   No former tobacco use. Rare alcohol use. No illicit drug use. He is fairly active, he push mows his yard that he states is a decent size lot.  He is retired and lives with his wife.   Past Medical History:  Diagnosis Date  . HTN (hypertension) 01/04/2019  . Hyperlipidemia     Past Surgical History:  Procedure Laterality Date  . CATARACT EXTRACTION Left 01/29/2019  . HERNIA REPAIR      Social History   Socioeconomic History  . Marital status: Married    Spouse name: Not on file  . Number of children: 1  . Years of education: Not on file  . Highest education level: Not on file  Occupational History  . Not on file  Social Needs  . Financial resource strain: Not on file  . Food insecurity    Worry: Not on file    Inability: Not on file  . Transportation needs    Medical: Not on file    Non-medical: Not on file  Tobacco Use  . Smoking status: Never Smoker  . Smokeless tobacco: Never Used  Substance and Sexual Activity  . Alcohol use: Not Currently    Frequency: Never  . Drug use: Never  . Sexual activity: Not on file  Lifestyle  . Physical activity    Days per week: Not on file    Minutes per session: Not on file  . Stress: Not on file  Relationships  . Social Herbalist on phone: Not on file    Gets together: Not on file    Attends religious service: Not on file    Active member of club or organization: Not on file    Attends meetings of clubs or organizations: Not on file     Relationship status: Not on file  . Intimate partner violence    Fear of current or ex partner: Not on file    Emotionally abused: Not on file    Physically abused: Not on file    Forced sexual activity: Not on file  Other Topics Concern  . Not on file  Social History Narrative  . Not on file    Review of Systems  Constitution: Negative for decreased appetite, malaise/fatigue, weight gain and weight loss.  Eyes: Negative for visual disturbance.  Cardiovascular: Positive for chest pain. Negative for claudication, dyspnea on exertion, leg swelling, orthopnea, palpitations and syncope.  Respiratory: Negative for hemoptysis and wheezing.   Endocrine: Negative for cold intolerance and heat intolerance.  Hematologic/Lymphatic: Does not bruise/bleed easily.  Skin: Negative for nail changes.  Musculoskeletal: Negative for muscle weakness and myalgias.  Gastrointestinal: Negative for abdominal pain, change in bowel habit, nausea and vomiting.  Neurological: Negative for difficulty with concentration, dizziness, focal weakness and headaches.  Psychiatric/Behavioral: Negative for altered mental status and suicidal ideas.  All other systems reviewed and are negative.     Objective:  Blood pressure 132/88, pulse 60, temperature (!)  97.3 F (36.3 C), temperature source Skin, resp. rate 16, height _0  (1.727 m), weight 87.1 kg, SpO2 100 %. Body mass index is 29.19 kg/m.    Physical Exam  Constitutional: He is oriented to person, place, and time. Vital signs are normal. He appears well-developed and well-nourished.  HENT:  Head: Normocephalic and atraumatic.  Neck: Normal range of motion.  Cardiovascular: Normal rate, regular rhythm, normal heart sounds and intact distal pulses.  Pulses:      Dorsalis pedis pulses are 2+ on the right side and 2+ on the left side.       Posterior tibial pulses are 2+ on the right side and 2+ on the left side.  Ejection click at ULSB  Pulmonary/Chest:  Effort normal and breath sounds normal. No accessory muscle usage. No respiratory distress.  Abdominal: Soft. Bowel sounds are normal.  Musculoskeletal: Normal range of motion.  Neurological: He is alert and oriented to person, place, and time.  Skin: Skin is warm and dry.  Vitals reviewed.  Radiology: No results found.  Laboratory examination:   12/25/2018: TSH 1.42.  Creatinine 1.3 EGFR 54/66, potassium 4.8, CMP otherwise normal.  Vitamin D 30.  Cholesterol 189, triglycerides 70, HDL 44, LDL 131.  CMP Latest Ref Rng & Units 03/01/2019  Glucose 65 - 99 mg/dL 117(H)  BUN 8 - 27 mg/dL 19  Creatinine 0.76 - 1.27 mg/dL 1.41(H)  Sodium 134 - 144 mmol/L 142  Potassium 3.5 - 5.2 mmol/L 4.5  Chloride 96 - 106 mmol/L 106  CO2 20 - 29 mmol/L 23  Calcium 8.6 - 10.2 mg/dL 9.6   CBC Latest Ref Rng & Units 03/01/2019  WBC 3.4 - 10.8 x10E3/uL 5.5  Hemoglobin 13.0 - 17.7 g/dL 13.5  Hematocrit 37.5 - 51.0 % 42.2  Platelets 150 - 450 x10E3/uL 131(L)   Lipid Panel  No results found for: CHOL, TRIG, HDL, CHOLHDL, VLDL, LDLCALC, LDLDIRECT HEMOGLOBIN A1C No results found for: HGBA1C, MPG TSH No results for input(s): TSH in the last 8760 hours.  PRN Meds:. Medications Discontinued During This Encounter  Medication Reason  . Fexofenadine HCl (ALLERGY 24-HR PO) Dose change  . 0.9% sodium chloride infusion   . 0.9% sodium chloride infusion    Current Meds  Medication Sig  . aspirin EC 81 MG tablet Take 81 mg by mouth daily.   . diphenhydrAMINE (BENADRYL) 25 MG tablet Take 25 mg by mouth daily.  . DUREZOL 0.05 % EMUL Place 1 drop into the left eye 4 (four) times daily.   Marland Kitchen dutasteride (AVODART) 0.5 MG capsule Take 0.5 mg by mouth daily.   Marland Kitchen gatifloxacin (ZYMAXID) 0.5 % SOLN Place 1 drop into the left eye daily.   Marland Kitchen latanoprost (XALATAN) 0.005 % ophthalmic solution Place 1 drop into both eyes at bedtime.  Marland Kitchen losartan-hydrochlorothiazide (HYZAAR) 100-25 MG tablet Take 1 tablet by mouth daily.  .  rosuvastatin (CRESTOR) 10 MG tablet TAKE 1 TABLET BY MOUTH EVERY DAY (Patient taking differently: Take 10 mg by mouth daily. )  . timolol (TIMOPTIC) 0.5 % ophthalmic solution Place 1 drop into both eyes daily.  . vitamin C (ASCORBIC ACID) 500 MG tablet Take 500 mg by mouth daily.  . Vitamin D, Cholecalciferol, 25 MCG (1000 UT) TABS Take 1,000 Units by mouth daily.     Cardiac Studies:   Lexiscan Sestamibi Stress Test 01/21/2019: Resting EKG months normal sinus rhythm, poor R-wave progression, nonspecific diffuse T abnormality.  Stress EKG non-diagnostic due to pharmacologic stress testing, There  was pseudo-normalization of T inversion. No symptoms were experienced. Perfusion imaging study demonstrates a moderate-sized moderate ischemia involving the anteroseptal and apical anterolateral ischemia extending from the mid ventricle.  Left ventricle systolic function Related by QGS was mildly depressed at 44% with anterior hypokinesis.  High risk study.  Echo- 01/23/2019 1. Normal LV systolic function with EF 58%. Left ventricle cavity is normal in size. Moderate concentric hypertrophy of the left ventricle. Normal global wall motion. Doppler evidence of grade I (impaired) diastolic dysfunction. Calculated EF 58%. 2. Mild (Grade I) mitral regurgitation. 3. Mild tricuspid regurgitation. No evidence of pulmonary hypertension.  Assessment:   1. Chest pain atypical but has high risk stress test suggesting LAD territory ischemia.  EKG 01/04/2019: Normal sinus rhythm at 61 bpm, normal axis, T wave inversion/ flattening in inferior and anterolateral leads, cannot exclude ischemia. Abnormal EKG 2. Primary hypertension 3. Hyperlipidemia  Recommendations:   Fortunately since last seen by [place], fortunately he has not had severe  chest pain, one episode with exertion.  He has been doing his normal activities.  I discussed recently obtained stress test and echocardiogram results with the patient again.   He has moderate size perfusion abnormality in anterolateral consistent with EKG abnormalities.  Given his risk factors, high risk nuclear stress test, occasional chest pain, feel that his best option would be to pursue coronary angiogram for further evaluation.  Patient wants to proceed. Understands <1-2% risk of death, stroke, MI, urgent CABG, bleeding, infection, renal failure but not limited to these.  He is not on a BB, but on statins and ASA and losartan. BP is controlled. Will make recommendations following heart catheterization.   Adrian Prows, MD, [facility] 03/12/2019, 12:08 PM [facility]. [place] Pager: [phone] Office: [phone] If no answer Cell [phone]

## 2019-03-12 NOTE — Discharge Instructions (Signed)
Drink plenty of fluids °Keep right arm at or above heart level  °Radial Site Care ° °This sheet gives you information about how to care for yourself after your procedure. Your health care provider may also give you more specific instructions. If you have problems or questions, contact your health care provider. °What can I expect after the procedure? °After the procedure, it is common to have: °· Bruising and tenderness at the catheter insertion area. °Follow these instructions at home: °Medicines °· Take over-the-counter and prescription medicines only as told by your health care provider. °Insertion site care °· Follow instructions from your health care provider about how to take care of your insertion site. Make sure you: °? Wash your hands with soap and water before you change your bandage (dressing). If soap and water are not available, use hand sanitizer. °? Change your dressing as told by your health care provider. °? Leave stitches (sutures), skin glue, or adhesive strips in place. These skin closures may need to stay in place for 2 weeks or longer. If adhesive strip edges start to loosen and curl up, you may trim the loose edges. Do not remove adhesive strips completely unless your health care provider tells you to do that. °· Check your insertion site every day for signs of infection. Check for: °? Redness, swelling, or pain. °? Fluid or blood. °? Pus or a bad smell. °? Warmth. °· Do not take baths, swim, or use a hot tub until your health care provider approves. °· You may shower 24-48 hours after the procedure, or as directed by your health care provider. °? Remove the dressing and gently wash the site with plain soap and water. °? Pat the area dry with a clean towel. °? Do not rub the site. That could cause bleeding. °· Do not apply powder or lotion to the site. °Activity ° °· For 24 hours after the procedure, or as directed by your health care provider: °? Do not flex or bend the affected arm. °? Do  not push or pull heavy objects with the affected arm. °? Do not drive yourself home from the hospital or clinic. You may drive 24 hours after the procedure unless your health care provider tells you not to. °? Do not operate machinery or power tools. °· Do not lift anything that is heavier than 10 lb (4.5 kg), or the limit that you are told, until your health care provider says that it is safe. °· Ask your health care provider when it is okay to: °? Return to work or school. °? Resume usual physical activities or sports. °? Resume sexual activity. °General instructions °· If the catheter site starts to bleed, raise your arm and put firm pressure on the site. If the bleeding does not stop, get help right away. This is a medical emergency. °· If you went home on the same day as your procedure, a responsible adult should be with you for the first 24 hours after you arrive home. °· Keep all follow-up visits as told by your health care provider. This is important. °Contact a health care provider if: °· You have a fever. °· You have redness, swelling, or yellow drainage around your insertion site. °Get help right away if: °· You have unusual pain at the radial site. °· The catheter insertion area swells very fast. °· The insertion area is bleeding, and the bleeding does not stop when you hold steady pressure on the area. °· Your arm or hand   becomes pale, cool, tingly, or numb. °These symptoms may represent a serious problem that is an emergency. Do not wait to see if the symptoms will go away. Get medical help right away. Call your local emergency services (911 in the [place] Do not drive yourself to the hospital. °Summary °· After the procedure, it is common to have bruising and tenderness at the site. °· Follow instructions from your health care provider about how to take care of your radial site wound. Check the wound every day for signs of infection. °· Do not lift anything that is heavier than 10 lb (4.5 kg), or the  limit that you are told, until your health care provider says that it is safe. °This information is not intended to replace advice given to you by your health care provider. Make sure you discuss any questions you have with your health care provider. °Document Released: 07/16/2010 Document Revised: 07/19/2017 Document Reviewed: 07/19/2017 °Elsevier Patient Education © 2020 [employer] ° °

## 2019-03-12 NOTE — Progress Notes (Addendum)
Discharge instructions reviewed with pt and his wife. Both voice understanding. Pt wife updated and given d/c time.

## 2019-03-12 NOTE — Progress Notes (Addendum)
Hematoma noted to right wrist. Pressure held and cath lab called to assess.  Cath lab here and reapplied TRB with 14 cc  Dr Einar Gip called and informed. He states if the hematoma is not better he will admit him for obs.

## 2019-03-12 NOTE — Interval H&P Note (Signed)
History and Physical Interval Note:  03/12/2019 12:14 PM  Bradley Parker  has presented today for surgery, with the diagnosis of chest pain - abnormal stress.  The various methods of treatment have been discussed with the patient and family. After consideration of risks, benefits and other options for treatment, the patient has consented to  Procedure(s): LEFT HEART CATH AND CORONARY ANGIOGRAPHY (N/A) and possible angioplasty as a surgical intervention.  The patient's history has been reviewed, patient examined, no change in status, stable for surgery.  I have reviewed the patient's chart and labs.  Questions were answered to the patient's satisfaction.   Symptom Status: Ischemic Symptoms Non-invasive Testing: High risk If no or indeterminate stress test, FFR/iFR results in all diseased vessels: N/A Diabetes Mellitus: No S/P CABG: No Antianginal therapy (number of long-acting drugs): 0 Patient undergoing renal transplant: No Patient undergoing percutaneous valve procedure: No   1 Vessel Disease No proximal LAD involvement, No proximal left dominant LCX involvement  PCI: M (5);  Indication 2  CABG: M (4);  Indication 2 Proximal left dominant LCX involvement  PCI: M (6);  Indication 5  CABG: M (6);  Indication 5 Proximal LAD involvement  PCI: M (6);  Indication 5  CABG: M (6);  Indication 5  2 Vessel Disease No proximal LAD involvement  PCI: M (6);  Indication 8  CABG: M (5);  Indication 8 Proximal LAD involvement  PCI: A (7);  Indication 11  CABG: A (7);  Indication 11  3 Vessel Disease Low disease complexity (e.g., focal stenoses, SYNTAX <=22)  PCI: A (7);  Indication 17  CABG: A (7);  Indication 17 Intermediate or high disease complexity (e.g., SYNTAX >=23)  PCI: M (6);  Indication 21  CABG: A (7);  Indication 21  Left Main Disease Isolated LMCA disease: ostial or midshaft  PCI: A (7);  Indication 24  CABG: A (8);  Indication 24 Isolated LMCA disease: bifurcation  involvement  PCI: M (5);  Indication 25  CABG: A (8);  Indication 25 LMCA ostial or midshaft, concurrent low disease burden multivessel disease (e.g., 1-2 additional focal stenoses, SYNTAX <=22)  PCI: M (6);  Indication 26  CABG: A (9);  Indication 26 LMCA ostial or midshaft, concurrent intermediate or high disease burden multivessel disease (e.g., 1-2 additional bifurcation stenoses, long stenoses, SYNTAX >=23)  PCI: M (4);  Indication 27  CABG: A (9);  Indication 27 LMCA bifurcation involvement, concurrent low disease burden multivessel disease (e.g., 1-2 additional focal stenoses, SYNTAX <=22)  PCI: M (5);  Indication 28  CABG: A (8);  Indication 28 LMCA bifurcation involvement, concurrent intermediate or high disease burden multivessel disease (e.g., 1-2 additional bifurcation stenoses, long stenoses, SYNTAX >=23)  PCI: R (3);  Indication 29  CABG: A (9);  Indication 29  Notes:  A indicates appropriate. M indicates may be appropriate. R indicates rarely appropriate. Number in parentheses is median score for that indication. Reclassify indicates number of functionally diseased vessels should be decreased given negative FFR/iFR. Re-evaluate the scenario interpreting any FFR/iFR negative vessel as being not significantly stenosed.  Disease means involved vessel provides flow to a sufficient amount of myocardium to be clinically important.  If FFR testing indicates a vessel is not significant, that vessel should not be considered diseased (and the patient should be reclassified with respect to extent of functionally significant disease).  Proximal LAD + proximal left dominant LCX is considered 3 vessel CAD  2 Vessel CAD with FFR/iFR abnormal in only 1 but  not both is considered 1 vessel CAD  Disease complexity includes occlusion, bifurcation, trifurcation, ostial, >20 mm, tortuosity, calcification, thrombus  LMCA disease is >=50% by angiography, MLD <2.8 mm, MLA <6 mm2; MLA 6-7.5 mm2 requires  further physiologic  See Table B for risk stratification based on noninvasive testing  Journal of the [employer] College of [employer]iology Mar 2017, 23391; DOI: 10.1016/j.jacc.2017.02.001 [email or web address].onlinejacc.org/content/[email or web address]/09/03/j.[email or web address].full-text.pdf This App  2018 by the Society for Cardiovascular Angiography and Interventions  Yates DecampJay Shefali Parker

## 2019-03-13 ENCOUNTER — Encounter (HOSPITAL_COMMUNITY): Payer: Self-pay | Admitting: Cardiology

## 2019-03-15 ENCOUNTER — Telehealth (HOSPITAL_COMMUNITY): Payer: Self-pay

## 2019-03-15 NOTE — Telephone Encounter (Signed)
Pt insurance is active and benefits verified through [facility] Medicare. Co-pay $20.00, DED $0.00/$0.00 met, out of pocket $3,900.00/$547.91 met, co-insurance 0%. No pre-authorization required. Passport, 03/15/2019 @ 11:41AM, CRF#[id number]  Will contact patient to see if he is interested in the Cardiac Rehab Program. If interested, patient will need to complete follow up appt. Once completed, patient will be contacted for scheduling upon review by the RN Navigator.

## 2019-03-15 NOTE — Telephone Encounter (Signed)
Called patient to see if he is interested in the Cardiac Rehab Program. Patient expressed interest. Explained scheduling process and went over insurance, patient verbalized understanding. Will contact patient for scheduling once f/u has been completed.  °

## 2019-03-19 ENCOUNTER — Telehealth (HOSPITAL_COMMUNITY): Payer: Self-pay

## 2019-03-19 NOTE — Telephone Encounter (Signed)
Called and spoke with pt in regards to CR, pt stated he would like to wait until he complete his f/u appt w/ Dr. Einar Gip on 9/23.  Will contact pt after his f/u on 9/23.

## 2019-03-20 ENCOUNTER — Other Ambulatory Visit: Payer: Self-pay | Admitting: Cardiology

## 2019-03-20 ENCOUNTER — Encounter: Payer: Self-pay | Admitting: Cardiology

## 2019-03-20 ENCOUNTER — Ambulatory Visit: Payer: Medicare Other | Admitting: Cardiology

## 2019-03-20 ENCOUNTER — Other Ambulatory Visit: Payer: Self-pay

## 2019-03-20 VITALS — BP 129/88 | HR 58 | Ht 68.0 in | Wt 200.0 lb

## 2019-03-20 DIAGNOSIS — E785 Hyperlipidemia, unspecified: Secondary | ICD-10-CM | POA: Diagnosis not present

## 2019-03-20 DIAGNOSIS — I10 Essential (primary) hypertension: Secondary | ICD-10-CM

## 2019-03-20 DIAGNOSIS — R0789 Other chest pain: Secondary | ICD-10-CM | POA: Diagnosis not present

## 2019-03-20 DIAGNOSIS — Z95820 Peripheral vascular angioplasty status with implants and grafts: Secondary | ICD-10-CM

## 2019-03-20 DIAGNOSIS — I2510 Atherosclerotic heart disease of native coronary artery without angina pectoris: Secondary | ICD-10-CM | POA: Diagnosis not present

## 2019-03-20 NOTE — Progress Notes (Signed)
Primary Physician:  [name], MD   Patient ID: Bradley Parker, male    DOB: June 15, 1951, 68 y.o.   MRN: [id number]  Subjective:    Chief Complaint  Patient presents with  . Chest Pain  . Hypertension  . Follow-up    HPI: Bradley Parker  is a 68 y.o. male  with hypertension, preglaucoma, Vitamin D deficiency, although he had atypical chest pain, in view of high risk stress test, underwent coronary angiogram on 03/12/2019 revealing high grade lesion in LAD in which he underwent PCI. He now presents for follow up .  At discharge, started on Plavix and Metoprolol succinate.  Since being at home, he has not had any episodes of chest discomfort.  He is feeling well.  He has been doing some light activities around his house, but is anxious to get back to push mowing his yard.  Tolerating medications well.  No former tobacco use. Rare alcohol use. No illicit drug use. He is fairly active, he push mows his yard that he states is a decent size lot.   He is retired and lives with his wife. Has one daughter that lives in [place].   Past Medical History:  Diagnosis Date  . HTN (hypertension) 01/04/2019  . Hyperlipidemia     Past Surgical History:  Procedure Laterality Date  . CATARACT EXTRACTION Left 01/29/2019  . CORONARY STENT INTERVENTION N/A 03/12/2019   Procedure: CORONARY STENT INTERVENTION;  Surgeon: Adrian Prows, MD;  Location: [facility];  Service: Cardiovascular;  Laterality: N/A;  . HERNIA REPAIR    . LEFT HEART CATH AND CORONARY ANGIOGRAPHY N/A 03/12/2019   Procedure: LEFT HEART CATH AND CORONARY ANGIOGRAPHY;  Surgeon: Adrian Prows, MD;  Location: [facility];  Service: Cardiovascular;  Laterality: N/A;    Social History   Socioeconomic History  . Marital status: Married    Spouse name: Not on file  . Number of children: 1  . Years of education: Not on file  . Highest education level: Not on file  Occupational History  . Not on file  Social Needs  .  Financial resource strain: Not on file  . Food insecurity    Worry: Not on file    Inability: Not on file  . Transportation needs    Medical: Not on file    Non-medical: Not on file  Tobacco Use  . Smoking status: Never Smoker  . Smokeless tobacco: Never Used  Substance and Sexual Activity  . Alcohol use: Not Currently    Frequency: Never  . Drug use: Never  . Sexual activity: Not on file  Lifestyle  . Physical activity    Days per week: Not on file    Minutes per session: Not on file  . Stress: Not on file  Relationships  . Social Herbalist on phone: Not on file    Gets together: Not on file    Attends religious service: Not on file    Active member of club or organization: Not on file    Attends meetings of clubs or organizations: Not on file    Relationship status: Not on file  . Intimate partner violence    Fear of current or ex partner: Not on file    Emotionally abused: Not on file    Physically abused: Not on file    Forced sexual activity: Not on file  Other Topics Concern  . Not on file  Social History Narrative  .  Not on file    Review of Systems  Constitution: Negative for decreased appetite, malaise/fatigue, weight gain and weight loss.  Eyes: Negative for visual disturbance.  Cardiovascular: Negative for chest pain, claudication, dyspnea on exertion, leg swelling, orthopnea, palpitations and syncope.  Respiratory: Negative for hemoptysis and wheezing.   Endocrine: Negative for cold intolerance and heat intolerance.  Hematologic/Lymphatic: Does not bruise/bleed easily.  Skin: Negative for nail changes.  Musculoskeletal: Negative for muscle weakness and myalgias.  Gastrointestinal: Negative for abdominal pain, change in bowel habit, nausea and vomiting.  Neurological: Negative for difficulty with concentration, dizziness, focal weakness and headaches.  Psychiatric/Behavioral: Negative for altered mental status and suicidal ideas.  All other  systems reviewed and are negative.     Objective:  Blood pressure 129/88, pulse (!) 58, height 5' 8" (1.727 m), weight 200 lb (90.7 kg), SpO2 99 %. Body mass index is 30.41 kg/m.    Physical Exam  Constitutional: He is oriented to person, place, and time. Vital signs are normal. He appears well-developed and well-nourished.  HENT:  Head: Normocephalic and atraumatic.  Neck: Normal range of motion.  Cardiovascular: Normal rate, regular rhythm, normal heart sounds and intact distal pulses.  Pulses:      Dorsalis pedis pulses are 2+ on the right side and 2+ on the left side.       Posterior tibial pulses are 2+ on the right side and 2+ on the left side.  Ejection click at ULSB  Pulmonary/Chest: Effort normal and breath sounds normal. No accessory muscle usage. No respiratory distress.  Abdominal: Soft. Bowel sounds are normal.  Musculoskeletal: Normal range of motion.  Neurological: He is alert and oriented to person, place, and time.  Skin: Skin is warm and dry.  Vitals reviewed.  Radiology: No results found.  Laboratory examination:   12/25/2018: TSH 1.42.  Creatinine 1.3 EGFR 54/66, potassium 4.8, CMP otherwise normal.  Vitamin D 30.  Cholesterol 189, triglycerides 70, HDL 44, LDL 131.  CMP Latest Ref Rng & Units 03/01/2019  Glucose 65 - 99 mg/dL 117(H)  BUN 8 - 27 mg/dL 19  Creatinine 0.76 - 1.27 mg/dL 1.41(H)  Sodium 134 - 144 mmol/L 142  Potassium 3.5 - 5.2 mmol/L 4.5  Chloride 96 - 106 mmol/L 106  CO2 20 - 29 mmol/L 23  Calcium 8.6 - 10.2 mg/dL 9.6   CBC Latest Ref Rng & Units 03/01/2019  WBC 3.4 - 10.8 x10E3/uL 5.5  Hemoglobin 13.0 - 17.7 g/dL 13.5  Hematocrit 37.5 - 51.0 % 42.2  Platelets 150 - 450 x10E3/uL 131(L)   Lipid Panel  No results found for: CHOL, TRIG, HDL, CHOLHDL, VLDL, LDLCALC, LDLDIRECT HEMOGLOBIN A1C No results found for: HGBA1C, MPG TSH No results for input(s): TSH in the last 8760 hours.  PRN Meds:. Medications Discontinued During This  Encounter  Medication Reason  . DUREZOL 0.05 % EMUL Error  . gatifloxacin (ZYMAXID) 0.5 % SOLN Error  . rosuvastatin (CRESTOR) 10 MG tablet Error   Current Meds  Medication Sig  . aspirin EC 81 MG tablet Take 81 mg by mouth daily.   . clopidogrel (PLAVIX) 75 MG tablet Take 1 tablet (75 mg total) by mouth daily.  . diphenhydrAMINE (BENADRYL) 25 MG tablet Take 25 mg by mouth daily.  Marland Kitchen dutasteride (AVODART) 0.5 MG capsule Take 0.5 mg by mouth daily.   Marland Kitchen latanoprost (XALATAN) 0.005 % ophthalmic solution Place 1 drop into both eyes at bedtime.  Marland Kitchen losartan-hydrochlorothiazide (HYZAAR) 100-25 MG tablet Take 1 tablet  by mouth daily.  . metoprolol succinate (TOPROL XL) 25 MG 24 hr tablet Take 1 tablet (25 mg total) by mouth daily.  . nitroGLYCERIN (NITROSTAT) 0.4 MG SL tablet Place 1 tablet (0.4 mg total) under the tongue every 5 (five) minutes as needed for up to 25 days for chest pain.  . rosuvastatin (CRESTOR) 20 MG tablet Take 1 tablet (20 mg total) by mouth daily.  . timolol (TIMOPTIC) 0.5 % ophthalmic solution Place 1 drop into both eyes daily.  . vitamin C (ASCORBIC ACID) 500 MG tablet Take 500 mg by mouth daily.  . Vitamin D, Cholecalciferol, 25 MCG (1000 UT) TABS Take 1,000 Units by mouth daily.   . [DISCONTINUED] DUREZOL 0.05 % EMUL Place 1 drop into the left eye 4 (four) times daily.   . [DISCONTINUED] gatifloxacin (ZYMAXID) 0.5 % SOLN Place 1 drop into the left eye daily.     Cardiac Studies:   Coronary Angiography 03/12/19: Normal LVEF. Normal LVEDP. Ost LM lesion is 15% stenosed.  Noraml RCA and RI and RCA. Mid LAD lesion is 95% stenosedS/P BALL[place]ON WOLVERINE 3.50X10 followed by [place] 3.5X18. Maximum pressure: 12 atm. 95% to 0%.   Lexiscan Sestamibi Stress Test 01/21/2019: Resting EKG months normal sinus rhythm, poor R-wave progression, nonspecific diffuse T abnormality.  Stress EKG non-diagnostic due to pharmacologic stress testing, There was pseudo-normalization of T  inversion. No symptoms were experienced. Perfusion imaging study demonstrates a moderate-sized moderate ischemia involving the anteroseptal and apical anterolateral ischemia extending from the mid ventricle.  Left ventricle systolic function Related by QGS was mildly depressed at 44% with anterior hypokinesis.  High risk study.  Echo- 01/23/2019 1. Normal LV systolic function with EF 58%. Left ventricle cavity is normal in size. Moderate concentric hypertrophy of the left ventricle. Normal global wall motion. Doppler evidence of grade I (impaired) diastolic dysfunction. Calculated EF 58%. 2. Mild (Grade I) mitral regurgitation. 3. Mild tricuspid regurgitation. No evidence of pulmonary hypertension.  Assessment:     ICD-10-CM   1. Atherosclerosis of native coronary artery of native heart without angina pectoris  I25.10 EKG 12-Lead  2. Status post angioplasty with stent  Z95.820   3. Essential hypertension  I10   4. Mild hyperlipidemia  E78.5     EKG 03/20/2019: Sinus bradycardia at 59 bpm, normal axis, T wave flattening in anterolateral leads, cannot exclude ischemia.  No changes compared to previous EKG.  Recommendations:   Patient is presently doing well without any symptoms of angina.  He has recovered from coronary angiogram.  He is on appropriate medical therapy, will continue the same.  He will need dual antiplatelet therapy for at least 1 year, and then aspirin indefinitely.  Blood pressure is well controlled.  Right radial access site is healing well.  He is planning to start cardiac rehab soon, which I have encouraged him to do so.  He is feeling very well.  He asked if he can push mow his yard with self-propelled push mower, which I see no contraindication.  But advised him to take breaks if needed.  I will plan to see him back in 3 months or sooner if problems. Plan was discussed with his wife as well over speaker phone.   Miquel Dunn, MSN, APRN, FNP-C [facility]. [place] Office: [phone] Fax: [phone]

## 2019-03-23 ENCOUNTER — Telehealth (HOSPITAL_COMMUNITY): Payer: Self-pay | Admitting: Pharmacist

## 2019-03-23 NOTE — Telephone Encounter (Signed)
Cardiac Rehab Medication Review by a Pharmacist  Does the patient  feel that his/her medications are working for him/her?  yes  Has the patient been experiencing any side effects to the medications prescribed?  no  Does the patient measure his/her own blood pressure or blood glucose at home?  yes . Most common BP readings are 115-126/78. Pt does not have a history of diabetes.  Does the patient have any problems obtaining medications due to transportation or finances?   no  Understanding of regimen: excellent Understanding of indications: excellent Potential of compliance: excellent   Sherren Kerns, PharmD PGY1 Acute Care Pharmacy Resident [phone] 03/23/2019 1:38 PM

## 2019-04-01 ENCOUNTER — Telehealth (HOSPITAL_COMMUNITY): Payer: Self-pay | Admitting: *Deleted

## 2019-04-01 NOTE — Telephone Encounter (Signed)
Spoke with the patient. Confirmed orientation appointment for tomorrow. Completed health history with the patient over the phone.Barnet Pall, RN,BSN 04/01/2019 2:28 PM

## 2019-04-02 ENCOUNTER — Encounter (HOSPITAL_COMMUNITY): Payer: Self-pay

## 2019-04-02 ENCOUNTER — Other Ambulatory Visit: Payer: Self-pay

## 2019-04-02 ENCOUNTER — Other Ambulatory Visit: Payer: Self-pay | Admitting: Cardiology

## 2019-04-02 ENCOUNTER — Encounter (HOSPITAL_COMMUNITY)
Admission: RE | Admit: 2019-04-02 | Discharge: 2019-04-02 | Disposition: A | Discharge status: Home / Self Care | Payer: Medicare Other | Source: Ambulatory Visit | Attending: Cardiology | Admitting: Cardiology

## 2019-04-02 VITALS — BP 108/72 | HR 81 | Temp 97.9°F | Ht 67.25 in | Wt 198.0 lb

## 2019-04-02 DIAGNOSIS — Z79899 Other long term (current) drug therapy: Secondary | ICD-10-CM | POA: Insufficient documentation

## 2019-04-02 DIAGNOSIS — I2510 Atherosclerotic heart disease of native coronary artery without angina pectoris: Secondary | ICD-10-CM | POA: Insufficient documentation

## 2019-04-02 DIAGNOSIS — E785 Hyperlipidemia, unspecified: Secondary | ICD-10-CM | POA: Insufficient documentation

## 2019-04-02 DIAGNOSIS — I10 Essential (primary) hypertension: Secondary | ICD-10-CM | POA: Insufficient documentation

## 2019-04-02 DIAGNOSIS — Z7902 Long term (current) use of antithrombotics/antiplatelets: Secondary | ICD-10-CM | POA: Insufficient documentation

## 2019-04-02 DIAGNOSIS — Z48812 Encounter for surgical aftercare following surgery on the circulatory system: Secondary | ICD-10-CM | POA: Insufficient documentation

## 2019-04-02 DIAGNOSIS — Z955 Presence of coronary angioplasty implant and graft: Secondary | ICD-10-CM

## 2019-04-02 DIAGNOSIS — Z7982 Long term (current) use of aspirin: Secondary | ICD-10-CM | POA: Insufficient documentation

## 2019-04-02 HISTORY — DX: Atherosclerotic heart disease of native coronary artery without angina pectoris: I25.10

## 2019-04-02 NOTE — Progress Notes (Signed)
Cardiac Individual Treatment Plan  Patient Details  Name: Bradley Parker MRN: [id number] Date of Birth: Jul 06, 1950 Referring Provider:     CARDIAC REHAB PHASE II ORIENTATION from 04/02/2019 in [facility] CARDIAC REHAB  Referring Provider  Dr Yates Decamp MD      Initial Encounter Date:    CARDIAC REHAB PHASE II ORIENTATION from 04/02/2019 in [facility] CARDIAC REHAB  Date  04/02/19      Visit Diagnosis: 03/12/19 DES LAD  Patient's Home Medications on Admission:  Current Outpatient Medications:  .  aspirin EC 81 MG tablet, Take 81 mg by mouth daily. , Disp: , Rfl:  .  clopidogrel (PLAVIX) 75 MG tablet, Take 1 tablet (75 mg total) by mouth daily., Disp: 30 tablet, Rfl: 0 .  diphenhydrAMINE (BENADRYL) 25 MG tablet, Take 25 mg by mouth daily., Disp: , Rfl:  .  dutasteride (AVODART) 0.5 MG capsule, Take 0.5 mg by mouth daily. , Disp: , Rfl:  .  latanoprost (XALATAN) 0.005 % ophthalmic solution, Place 1 drop into both eyes at bedtime., Disp: , Rfl:  .  losartan-hydrochlorothiazide (HYZAAR) 100-25 MG tablet, Take 1 tablet by mouth daily., Disp: 90 tablet, Rfl: 2 .  metoprolol succinate (TOPROL XL) 25 MG 24 hr tablet, Take 1 tablet (25 mg total) by mouth daily., Disp: 30 tablet, Rfl: 11 .  rosuvastatin (CRESTOR) 20 MG tablet, Take 1 tablet (20 mg total) by mouth daily., Disp: 90 tablet, Rfl: 1 .  timolol (TIMOPTIC) 0.5 % ophthalmic solution, Place 1 drop into both eyes daily., Disp: , Rfl:  .  vitamin C (ASCORBIC ACID) 500 MG tablet, Take 500 mg by mouth daily., Disp: , Rfl:  .  Vitamin D, Cholecalciferol, 25 MCG (1000 UT) TABS, Take 1,000 Units by mouth daily. , Disp: , Rfl:  .  nitroGLYCERIN (NITROSTAT) 0.4 MG SL tablet, Place 1 tablet (0.4 mg total) under the tongue every 5 (five) minutes as needed for up to 25 days for chest pain. (Patient not taking: Reported on 03/23/2019), Disp: 25 tablet, Rfl: 3  Past Medical History: Past Medical History:  Diagnosis  Date  . Coronary artery disease   . HTN (hypertension) 01/04/2019  . Hyperlipidemia     Tobacco Use: Social History   Tobacco Use  Smoking Status Never Smoker  Smokeless Tobacco Never Used    Labs: Recent Review Flowsheet Data    There is no flowsheet data to display.      Capillary Blood Glucose: No results found for: GLUCAP   Exercise Target Goals: Exercise Program Goal: Individual exercise prescription set using results from initial 6 min walk test and THRR while considering  patient's activity barriers and safety.   Exercise Prescription Goal: Starting with aerobic activity 30 plus minutes a day, 3 days per week for initial exercise prescription. Provide home exercise prescription and guidelines that participant acknowledges understanding prior to discharge.  Activity Barriers & Risk Stratification: Activity Barriers & Cardiac Risk Stratification - 04/02/19 1112      Activity Barriers & Cardiac Risk Stratification   Activity Barriers  None    Cardiac Risk Stratification  Low       6 Minute Walk: 6 Minute Walk    Row Name 04/02/19 1020         6 Minute Walk   Phase  Initial     Distance  1450 feet     Walk Time  6 minutes     # of Rest Breaks  0  MPH  2.75     METS  3.12     RPE  9     Perceived Dyspnea   0     VO2 Peak  10.93     Symptoms  No     Resting HR  81 bpm     Resting BP  108/72     Resting Oxygen Saturation   99 %     Exercise Oxygen Saturation  during 6 min walk  99 %     Max Ex. HR  100 bpm     Max Ex. BP  114/78     2 Minute Post BP  98/70        Oxygen Initial Assessment:   Oxygen Re-Evaluation:   Oxygen Discharge (Final Oxygen Re-Evaluation):   Initial Exercise Prescription: Initial Exercise Prescription - 04/02/19 1100      Date of Initial Exercise RX and Referring Provider   Date  04/02/19    Referring Provider  Dr Yates DecampJay Ganji MD    Expected Discharge Date  05/31/19      Treadmill   MPH  2.8    Grade  0     Minutes  15    METs  3.14      NuStep   Level  3    SPM  85    Minutes  15    METs  3      Prescription Details   Frequency (times per week)  3    Duration  Progress to 30 minutes of continuous aerobic without signs/symptoms of physical distress      Intensity   THRR 40-80% of Max Heartrate  62-124    Ratings of Perceived Exertion  11-13    Perceived Dyspnea  0-4      Progression   Progression  Continue to progress workloads to maintain intensity without signs/symptoms of physical distress.      Resistance Training   Training Prescription  Yes    Weight  4lbs    Reps  10-15       Perform Capillary Blood Glucose checks as needed.  Exercise Prescription Changes:   Exercise Comments:   Exercise Goals and Review:  Exercise Goals    Row Name 04/02/19 1006             Exercise Goals   Increase Physical Activity  Yes       Intervention  Provide advice, education, support and counseling about physical activity/exercise needs.;Develop an individualized exercise prescription for aerobic and resistive training based on initial evaluation findings, risk stratification, comorbidities and participant's personal goals.       Expected Outcomes  Short Term: Attend rehab on a regular basis to increase amount of physical activity.;Long Term: Exercising regularly at least 3-5 days a week.;Long Term: Add in home exercise to make exercise part of routine and to increase amount of physical activity.       Increase Strength and Stamina  Yes       Intervention  Provide advice, education, support and counseling about physical activity/exercise needs.;Develop an individualized exercise prescription for aerobic and resistive training based on initial evaluation findings, risk stratification, comorbidities and participant's personal goals.       Expected Outcomes  Short Term: Increase workloads from initial exercise prescription for resistance, speed, and METs.;Short Term: Perform resistance  training exercises routinely during rehab and add in resistance training at home;Long Term: Improve cardiorespiratory fitness, muscular endurance and strength as measured by increased METs and  functional capacity ( )       Able to understand and use rate of perceived exertion (RPE) scale  Yes       Intervention  Provide education and explanation on how to use RPE scale       Expected Outcomes  Short Term: Able to use RPE daily in rehab to express subjective intensity level;Long Term:  Able to use RPE to guide intensity level when exercising independently       Knowledge and understanding of Target Heart Rate Range (THRR)  Yes       Intervention  Provide education and explanation of THRR including how the numbers were predicted and where they are located for reference       Expected Outcomes  Short Term: Able to state/look up THRR;Long Term: Able to use THRR to govern intensity when exercising independently;Short Term: Able to use daily as guideline for intensity in rehab       Able to check pulse independently  Yes       Intervention  Provide education and demonstration on how to check pulse in carotid and radial arteries.;Review the importance of being able to check your own pulse for safety during independent exercise       Expected Outcomes  Short Term: Able to explain why pulse checking is important during independent exercise;Long Term: Able to check pulse independently and accurately       Understanding of Exercise Prescription  Yes       Intervention  Provide education, explanation, and written materials on patient's individual exercise prescription       Expected Outcomes  Short Term: Able to explain program exercise prescription;Long Term: Able to explain home exercise prescription to exercise independently          Exercise Goals Re-Evaluation :    Discharge Exercise Prescription (Final Exercise Prescription Changes):   Nutrition:  Target Goals: Understanding of nutrition  guidelines, daily intake of sodium 1500mg , cholesterol 200mg , calories 30% from fat and 7% or less from saturated fats, daily to have 5 or more servings of fruits and vegetables.  Biometrics: Pre Biometrics - 04/02/19 1053      Pre Biometrics   Height  5' 7.25" (1.708 m)    Weight  89.8 kg    Waist Circumference  39.5 inches    Hip Circumference  45 inches    Waist to Hip Ratio  0.88 %    BMI (Calculated)  30.78    Triceps Skinfold  11 mm    % Body Fat  27.2 %    Grip Strength  43.5 kg    Flexibility  20 in    Single Leg Stand  21.5 seconds        Nutrition Therapy Plan and Nutrition Goals:   Nutrition Assessments:   Nutrition Goals Re-Evaluation:   Nutrition Goals Discharge (Final Nutrition Goals Re-Evaluation):   Psychosocial: Target Goals: Acknowledge presence or absence of significant depression and/or stress, maximize coping skills, provide positive support system. Participant is able to verbalize types and ability to use techniques and skills needed for reducing stress and depression.  Initial Review & Psychosocial Screening: Initial Psych Review & Screening - 04/02/19 1218      Initial Review   Current issues with  None Identified      Family Dynamics   Good Support System?  Yes   Mofield has his wife for support     Barriers   Psychosocial barriers to participate in program  There are  no identifiable barriers or psychosocial needs.      Screening Interventions   Interventions  Encouraged to exercise       Quality of Life Scores: Quality of Life - 04/02/19 1118      Quality of Life   Select  Quality of Life      Quality of Life Scores   Health/Function Pre  24.67 %    Socioeconomic Pre  27.86 %    Psych/Spiritual Pre  25.5 %    Family Pre  30 %    GLOBAL Pre  26.28 %      Scores of 19 and below usually indicate a poorer quality of life in these areas.  A difference of  2-3 points is a clinically meaningful difference.  A difference of 2-3  points in the total score of the Quality of Life Index has been associated with significant improvement in overall quality of life, self-image, physical symptoms, and general health in studies assessing change in quality of life.  PHQ-9: Recent Review Flowsheet Data    There is no flowsheet data to display.     Interpretation of Total Score  Total Score Depression Severity:  1-4 = Minimal depression, 5-9 = Mild depression, 10-14 = Moderate depression, 15-19 = Moderately severe depression, 20-27 = Severe depression   Psychosocial Evaluation and Intervention:   Psychosocial Re-Evaluation:   Psychosocial Discharge (Final Psychosocial Re-Evaluation):   Vocational Rehabilitation: Provide vocational rehab assistance to qualifying candidates.   Vocational Rehab Evaluation & Intervention: Vocational Rehab - 04/02/19 1219      Initial Vocational Rehab Evaluation & Intervention   Assessment shows need for Vocational Rehabilitation  No       Education: Education Goals: Education classes will be provided on a weekly basis, covering required topics. Participant will state understanding/return demonstration of topics presented.  Learning Barriers/Preferences: Learning Barriers/Preferences - 04/02/19 1121      Learning Barriers/Preferences   Learning Barriers  Sight    Learning Preferences  Skilled Demonstration       Education Topics: Hypertension, Hypertension Reduction -Define heart disease and high blood pressure. Discus how high blood pressure affects the body and ways to reduce high blood pressure.   Exercise and Your Heart -Discuss why it is important to exercise, the FITT principles of exercise, normal and abnormal responses to exercise, and how to exercise safely.   Angina -Discuss definition of angina, causes of angina, treatment of angina, and how to decrease risk of having angina.   Cardiac Medications -Review what the following cardiac medications are used for,  how they affect the body, and side effects that may occur when taking the medications.  Medications include Aspirin, Beta blockers, calcium channel blockers, ACE Inhibitors, angiotensin receptor blockers, diuretics, digoxin, and antihyperlipidemics.   Congestive Heart Failure -Discuss the definition of CHF, how to live with CHF, the signs and symptoms of CHF, and how keep track of weight and sodium intake.   Heart Disease and Intimacy -Discus the effect sexual activity has on the heart, how changes occur during intimacy as we age, and safety during sexual activity.   Smoking Cessation / COPD -Discuss different methods to quit smoking, the health benefits of quitting smoking, and the definition of COPD.   Nutrition I: Fats -Discuss the types of cholesterol, what cholesterol does to the heart, and how cholesterol levels can be controlled.   Nutrition II: Labels -Discuss the different components of food labels and how to read food label   Heart Parts/Heart Disease  and PAD -Discuss the anatomy of the heart, the pathway of blood circulation through the heart, and these are affected by heart disease.   Stress I: Signs and Symptoms -Discuss the causes of stress, how stress may lead to anxiety and depression, and ways to limit stress.   Stress II: Relaxation -Discuss different types of relaxation techniques to limit stress.   Warning Signs of Stroke / TIA -Discuss definition of a stroke, what the signs and symptoms are of a stroke, and how to identify when someone is having stroke.   Knowledge Questionnaire Score: Knowledge Questionnaire Score - 04/02/19 1120      Knowledge Questionnaire Score   Pre Score  21/24       Core Components/Risk Factors/Patient Goals at Admission: Personal Goals and Risk Factors at Admission - 04/02/19 1121      Core Components/Risk Factors/Patient Goals on Admission    Weight Management  Obesity;Yes    Intervention  Weight Management/Obesity:  Establish reasonable short term and long term weight goals.;Weight Management: Develop a combined nutrition and exercise program designed to reach desired caloric intake, while maintaining appropriate intake of nutrient and fiber, sodium and fats, and appropriate energy expenditure required for the weight goal.;Weight Management: Provide education and appropriate resources to help participant work on and attain dietary goals.;Obesity: Provide education and appropriate resources to help participant work on and attain dietary goals.    Admit Weight  197 lb 15.6 oz (89.8 kg)    Improve shortness of breath with ADL's  Yes    Intervention  Provide education, individualized exercise plan and daily activity instruction to help decrease symptoms of SOB with activities of daily living.    Expected Outcomes  Short Term: Improve cardiorespiratory fitness to achieve a reduction of symptoms when performing ADLs;Long Term: Be able to perform more ADLs without symptoms or delay the onset of symptoms    Hypertension  Yes    Intervention  Provide education on lifestyle modifcations including regular physical activity/exercise, weight management, moderate sodium restriction and increased consumption of fresh fruit, vegetables, and low fat dairy, alcohol moderation, and smoking cessation.;Monitor prescription use compliance.    Expected Outcomes  Short Term: Continued assessment and intervention until BP is < 140/20mm HG in hypertensive participants. < 130/66mm HG in hypertensive participants with diabetes, heart failure or chronic kidney disease.;Long Term: Maintenance of blood pressure at goal levels.    Lipids  Yes    Intervention  Provide education and support for participant on nutrition & aerobic/resistive exercise along with prescribed medications to achieve LDL 70mg , HDL >40mg .    Expected Outcomes  Short Term: Participant states understanding of desired cholesterol values and is compliant with medications  prescribed. Participant is following exercise prescription and nutrition guidelines.;Long Term: Cholesterol controlled with medications as prescribed, with individualized exercise RX and with personalized nutrition plan. Value goals: LDL < 70mg , HDL > 40 mg.       Core Components/Risk Factors/Patient Goals Review:    Core Components/Risk Factors/Patient Goals at Discharge (Final Review):    ITP Comments: ITP Comments    Row Name 04/02/19 0946           ITP Comments  Medical Director- Dr. Fransico Him, MD          Comments:Ulyssess attended orientation on 04/02/2019 to review rules and guidelines for program.  Completed 6 minute walk test, Intitial ITP, and exercise prescription.  VSS. Telemetry-Sinus Rhythm.  Asymptomatic. Safety measures and social distancing in place per CDC guidelines.Barnet Pall, RN,BSN  04/02/2019 12:27 PM

## 2019-04-08 ENCOUNTER — Other Ambulatory Visit: Payer: Self-pay

## 2019-04-08 ENCOUNTER — Encounter (HOSPITAL_COMMUNITY)
Admission: RE | Admit: 2019-04-08 | Discharge: 2019-04-08 | Disposition: A | Discharge status: Home / Self Care | Payer: Medicare Other | Source: Ambulatory Visit | Attending: Cardiology | Admitting: Cardiology

## 2019-04-08 DIAGNOSIS — Z7982 Long term (current) use of aspirin: Secondary | ICD-10-CM | POA: Diagnosis not present

## 2019-04-08 DIAGNOSIS — Z955 Presence of coronary angioplasty implant and graft: Secondary | ICD-10-CM

## 2019-04-08 DIAGNOSIS — E785 Hyperlipidemia, unspecified: Secondary | ICD-10-CM | POA: Diagnosis not present

## 2019-04-08 DIAGNOSIS — I2510 Atherosclerotic heart disease of native coronary artery without angina pectoris: Secondary | ICD-10-CM | POA: Diagnosis not present

## 2019-04-08 DIAGNOSIS — I10 Essential (primary) hypertension: Secondary | ICD-10-CM | POA: Diagnosis not present

## 2019-04-08 DIAGNOSIS — Z79899 Other long term (current) drug therapy: Secondary | ICD-10-CM | POA: Diagnosis not present

## 2019-04-08 DIAGNOSIS — Z48812 Encounter for surgical aftercare following surgery on the circulatory system: Secondary | ICD-10-CM | POA: Diagnosis present

## 2019-04-08 DIAGNOSIS — Z7902 Long term (current) use of antithrombotics/antiplatelets: Secondary | ICD-10-CM | POA: Diagnosis not present

## 2019-04-08 LAB — GLUCOSE, CAPILLARY: Glucose-Capillary: 113 mg/dL — ABNORMAL HIGH (ref 70–99)

## 2019-04-08 MED ORDER — CLOPIDOGREL BISULFATE 75 MG PO TABS: 75.0000 mg | ORAL_TABLET | Freq: Every day | ORAL | 2 refills | Status: DC

## 2019-04-08 NOTE — Progress Notes (Signed)
Daily Session Note  Patient Details  Name: Bradley Parker MRN: [id number] Date of Birth: [birth year] Referring Provider:     CARDIAC REHAB PHASE II ORIENTATION from 04/02/2019 in [facility]  Referring Provider  Dr Adrian Prows MD      Encounter Date: 04/08/2019  Check In: Session Check In - 04/08/19 0725      Check-In   Supervising physician immediately available to respond to emergencies  [facility] immediately available    Physician(s)  Dr Doristine Bosworth    Location  [facility]    Staff Present  Trish Fountain, RN, Deland Pretty, MS, ACSM CEP, Exercise Physiologist    Virtual Visit  No    Medication changes reported      No    Fall or balance concerns reported     No    Tobacco Cessation  No Change    Warm-up and Cool-down  Performed on first and last piece of equipment    Resistance Training Performed  Yes    VAD Patient?  No    PAD/SET Patient?  No      Pain Assessment   Currently in Pain?  No/denies       Capillary Blood Glucose: No results found for this or any previous visit (from the past 24 hour(s)).    Social History   Tobacco Use  Smoking Status Never Smoker  Smokeless Tobacco Never Used    Goals Met:  Exercise tolerated well No report of cardiac concerns or symptoms Strength training completed today  Goals Unmet:  Not Applicable  Comments: Pt started cardiac rehab today.  Pt tolerated light exercise without difficulty. VSS, telemetry-NSR, asymptomatic.  Medication list reconciled. Pt denies barriers to medicaiton compliance.  PSYCHOSOCIAL ASSESSMENT:  PHQ-0. Pt exhibits positive coping skills, hopeful outlook with supportive family. No psychosocial needs identified at this time, no psychosocial interventions necessary.  Pt oriented to exercise equipment and routine. Understanding verbalized.   Dr. Fransico Him is Medical Director for Cardiac Rehab at [facility].

## 2019-04-10 ENCOUNTER — Encounter (HOSPITAL_COMMUNITY)
Admission: RE | Admit: 2019-04-10 | Discharge: 2019-04-10 | Disposition: A | Discharge status: Home / Self Care | Payer: Medicare Other | Source: Ambulatory Visit | Attending: Cardiology | Admitting: Cardiology

## 2019-04-10 ENCOUNTER — Other Ambulatory Visit: Payer: Self-pay

## 2019-04-10 DIAGNOSIS — Z48812 Encounter for surgical aftercare following surgery on the circulatory system: Secondary | ICD-10-CM | POA: Diagnosis not present

## 2019-04-10 DIAGNOSIS — Z955 Presence of coronary angioplasty implant and graft: Secondary | ICD-10-CM

## 2019-04-11 NOTE — Progress Notes (Signed)
Cardiac Individual Treatment Plan  Patient Details  Name: Bradley Parker MRN: [id number] Date of Birth: June 09, 1951 Referring Provider:     CARDIAC REHAB PHASE II ORIENTATION from 04/02/2019 in [facility] CARDIAC REHAB  Referring Provider  Dr Yates Decamp MD      Initial Encounter Date:    CARDIAC REHAB PHASE II ORIENTATION from 04/02/2019 in [facility] CARDIAC REHAB  Date  04/02/19      Visit Diagnosis: 03/12/19 DES LAD  Patient's Home Medications on Admission:  Current Outpatient Medications:  .  aspirin EC 81 MG tablet, Take 81 mg by mouth daily. , Disp: , Rfl:  .  clopidogrel (PLAVIX) 75 MG tablet, Take 1 tablet (75 mg total) by mouth daily., Disp: 90 tablet, Rfl: 2 .  diphenhydrAMINE (BENADRYL) 25 MG tablet, Take 25 mg by mouth daily., Disp: , Rfl:  .  dutasteride (AVODART) 0.5 MG capsule, Take 0.5 mg by mouth daily. , Disp: , Rfl:  .  latanoprost (XALATAN) 0.005 % ophthalmic solution, Place 1 drop into both eyes at bedtime., Disp: , Rfl:  .  losartan-hydrochlorothiazide (HYZAAR) 100-25 MG tablet, Take 1 tablet by mouth daily., Disp: 90 tablet, Rfl: 2 .  metoprolol succinate (TOPROL XL) 25 MG 24 hr tablet, Take 1 tablet (25 mg total) by mouth daily., Disp: 30 tablet, Rfl: 11 .  nitroGLYCERIN (NITROSTAT) 0.4 MG SL tablet, Place 1 tablet (0.4 mg total) under the tongue every 5 (five) minutes as needed for up to 25 days for chest pain. (Patient not taking: Reported on 03/23/2019), Disp: 25 tablet, Rfl: 3 .  rosuvastatin (CRESTOR) 20 MG tablet, Take 1 tablet (20 mg total) by mouth daily., Disp: 90 tablet, Rfl: 1 .  timolol (TIMOPTIC) 0.5 % ophthalmic solution, Place 1 drop into both eyes daily., Disp: , Rfl:  .  vitamin C (ASCORBIC ACID) 500 MG tablet, Take 500 mg by mouth daily., Disp: , Rfl:  .  Vitamin D, Cholecalciferol, 25 MCG (1000 UT) TABS, Take 1,000 Units by mouth daily. , Disp: , Rfl:   Past Medical History: Past Medical History:  Diagnosis  Date  . Coronary artery disease   . HTN (hypertension) 01/04/2019  . Hyperlipidemia     Tobacco Use: Social History   Tobacco Use  Smoking Status Never Smoker  Smokeless Tobacco Never Used    Labs: Recent Review Flowsheet Data    There is no flowsheet data to display.      Capillary Blood Glucose: Lab Results  Component Value Date   GLUCAP 113 (H) 04/08/2019     Exercise Target Goals: Exercise Program Goal: Individual exercise prescription set using results from initial 6 min walk test and THRR while considering  patient's activity barriers and safety.   Exercise Prescription Goal: Starting with aerobic activity 30 plus minutes a day, 3 days per week for initial exercise prescription. Provide home exercise prescription and guidelines that participant acknowledges understanding prior to discharge.  Activity Barriers & Risk Stratification: Activity Barriers & Cardiac Risk Stratification - 04/02/19 1112      Activity Barriers & Cardiac Risk Stratification   Activity Barriers  None    Cardiac Risk Stratification  Low       6 Minute Walk: 6 Minute Walk    Row Name 04/02/19 1020         6 Minute Walk   Phase  Initial     Distance  1450 feet     Walk Time  6 minutes     #  of Rest Breaks  0     MPH  2.75     METS  3.12     RPE  9     Perceived Dyspnea   0     VO2 Peak  10.93     Symptoms  No     Resting HR  81 bpm     Resting BP  108/72     Resting Oxygen Saturation   99 %     Exercise Oxygen Saturation  during 6 min walk  99 %     Max Ex. HR  100 bpm     Max Ex. BP  114/78     2 Minute Post BP  98/70        Oxygen Initial Assessment:   Oxygen Re-Evaluation:   Oxygen Discharge (Final Oxygen Re-Evaluation):   Initial Exercise Prescription: Initial Exercise Prescription - 04/02/19 1100      Date of Initial Exercise RX and Referring Provider   Date  04/02/19    Referring Provider  Dr Yates Decamp MD    Expected Discharge Date  05/31/19       Treadmill   MPH  2.8    Grade  0    Minutes  15    METs  3.14      NuStep   Level  3    SPM  85    Minutes  15    METs  3      Prescription Details   Frequency (times per week)  3    Duration  Progress to 30 minutes of continuous aerobic without signs/symptoms of physical distress      Intensity   THRR 40-80% of Max Heartrate  62-124    Ratings of Perceived Exertion  11-13    Perceived Dyspnea  0-4      Progression   Progression  Continue to progress workloads to maintain intensity without signs/symptoms of physical distress.      Resistance Training   Training Prescription  Yes    Weight  4lbs    Reps  10-15       Perform Capillary Blood Glucose checks as needed.  Exercise Prescription Changes: Exercise Prescription Changes    Row Name 04/08/19 0717             Response to Exercise   Blood Pressure (Admit)  100/70       Blood Pressure (Exercise)  138/82       Blood Pressure (Exit)  98/70       Heart Rate (Admit)  74 bpm       Heart Rate (Exercise)  125 bpm       Heart Rate (Exit)  84 bpm       Rating of Perceived Exertion (Exercise)  11       Symptoms  none       Comments  Off to a good start with exercise.       Duration  Progress to 30 minutes of  aerobic without signs/symptoms of physical distress       Intensity  THRR unchanged         Progression   Progression  Continue to progress workloads to maintain intensity without signs/symptoms of physical distress.       Average METs  3.5         Resistance Training   Training Prescription  Yes       Weight  4lbs       Reps  10-15       Time  10 Minutes         Interval Training   Interval Training  No         Treadmill   MPH  2.8       Grade  0       Minutes  15       METs  3.14         NuStep   Level  4 Increased WL       SPM  85       Minutes  15       METs  3.8          Exercise Comments: Exercise Comments    Row Name 04/08/19 0815           Exercise Comments  Patient tolerated  1st exercise session well without symptoms.          Exercise Goals and Review: Exercise Goals    Row Name 04/02/19 1006             Exercise Goals   Increase Physical Activity  Yes       Intervention  Provide advice, education, support and counseling about physical activity/exercise needs.;Develop an individualized exercise prescription for aerobic and resistive training based on initial evaluation findings, risk stratification, comorbidities and participant's personal goals.       Expected Outcomes  Short Term: Attend rehab on a regular basis to increase amount of physical activity.;Long Term: Exercising regularly at least 3-5 days a week.;Long Term: Add in home exercise to make exercise part of routine and to increase amount of physical activity.       Increase Strength and Stamina  Yes       Intervention  Provide advice, education, support and counseling about physical activity/exercise needs.;Develop an individualized exercise prescription for aerobic and resistive training based on initial evaluation findings, risk stratification, comorbidities and participant's personal goals.       Expected Outcomes  Short Term: Increase workloads from initial exercise prescription for resistance, speed, and METs.;Short Term: Perform resistance training exercises routinely during rehab and add in resistance training at home;Long Term: Improve cardiorespiratory fitness, muscular endurance and strength as measured by increased METs and functional capacity ( )       Able to understand and use rate of perceived exertion (RPE) scale  Yes       Intervention  Provide education and explanation on how to use RPE scale       Expected Outcomes  Short Term: Able to use RPE daily in rehab to express subjective intensity level;Long Term:  Able to use RPE to guide intensity level when exercising independently       Knowledge and understanding of Target Heart Rate Range (THRR)  Yes       Intervention  Provide  education and explanation of THRR including how the numbers were predicted and where they are located for reference       Expected Outcomes  Short Term: Able to state/look up THRR;Long Term: Able to use THRR to govern intensity when exercising independently;Short Term: Able to use daily as guideline for intensity in rehab       Able to check pulse independently  Yes       Intervention  Provide education and demonstration on how to check pulse in carotid and radial arteries.;Review the importance of being able to check your own pulse for safety during independent exercise  Expected Outcomes  Short Term: Able to explain why pulse checking is important during independent exercise;Long Term: Able to check pulse independently and accurately       Understanding of Exercise Prescription  Yes       Intervention  Provide education, explanation, and written materials on patient's individual exercise prescription       Expected Outcomes  Short Term: Able to explain program exercise prescription;Long Term: Able to explain home exercise prescription to exercise independently          Exercise Goals Re-Evaluation : Exercise Goals Re-Evaluation    Row Name 04/08/19 0815             Exercise Goal Re-Evaluation   Exercise Goals Review  Able to understand and use rate of perceived exertion (RPE) scale;Increase Physical Activity       Comments  Patient able to understand and use RPE scale appropriately.       Expected Outcomes  Increase workloads as tolerated to help increase cardiorespiratory fitness.           Discharge Exercise Prescription (Final Exercise Prescription Changes): Exercise Prescription Changes - 04/08/19 0717      Response to Exercise   Blood Pressure (Admit)  100/70    Blood Pressure (Exercise)  138/82    Blood Pressure (Exit)  98/70    Heart Rate (Admit)  74 bpm    Heart Rate (Exercise)  125 bpm    Heart Rate (Exit)  84 bpm    Rating of Perceived Exertion (Exercise)  11     Symptoms  none    Comments  Off to a good start with exercise.    Duration  Progress to 30 minutes of  aerobic without signs/symptoms of physical distress    Intensity  THRR unchanged      Progression   Progression  Continue to progress workloads to maintain intensity without signs/symptoms of physical distress.    Average METs  3.5      Resistance Training   Training Prescription  Yes    Weight  4lbs    Reps  10-15    Time  10 Minutes      Interval Training   Interval Training  No      Treadmill   MPH  2.8    Grade  0    Minutes  15    METs  3.14      NuStep   Level  4   Increased WL   SPM  85    Minutes  15    METs  3.8       Nutrition:  Target Goals: Understanding of nutrition guidelines, daily intake of sodium 1500mg , cholesterol 200mg , calories 30% from fat and 7% or less from saturated fats, daily to have 5 or more servings of fruits and vegetables.  Biometrics: Pre Biometrics - 04/02/19 1053      Pre Biometrics   Height  5' 7.25" (1.708 m)    Weight  89.8 kg    Waist Circumference  39.5 inches    Hip Circumference  45 inches    Waist to Hip Ratio  0.88 %    BMI (Calculated)  30.78    Triceps Skinfold  11 mm    % Body Fat  27.2 %    Grip Strength  43.5 kg    Flexibility  20 in    Single Leg Stand  21.5 seconds        Nutrition Therapy Plan and  Nutrition Goals:   Nutrition Assessments:   Nutrition Goals Re-Evaluation:   Nutrition Goals Discharge (Final Nutrition Goals Re-Evaluation):   Psychosocial: Target Goals: Acknowledge presence or absence of significant depression and/or stress, maximize coping skills, provide positive support system. Participant is able to verbalize types and ability to use techniques and skills needed for reducing stress and depression.  Initial Review & Psychosocial Screening: Initial Psych Review & Screening - 04/02/19 1218      Initial Review   Current issues with  None Identified      Family Dynamics    Good Support System?  Yes   Sherrye PayorUlyssess has his wife for support     Barriers   Psychosocial barriers to participate in program  There are no identifiable barriers or psychosocial needs.      Screening Interventions   Interventions  Encouraged to exercise       Quality of Life Scores: Quality of Life - 04/02/19 1118      Quality of Life   Select  Quality of Life      Quality of Life Scores   Health/Function Pre  24.67 %    Socioeconomic Pre  27.86 %    Psych/Spiritual Pre  25.5 %    Family Pre  30 %    GLOBAL Pre  26.28 %      Scores of 19 and below usually indicate a poorer quality of life in these areas.  A difference of  2-3 points is a clinically meaningful difference.  A difference of 2-3 points in the total score of the Quality of Life Index has been associated with significant improvement in overall quality of life, self-image, physical symptoms, and general health in studies assessing change in quality of life.  PHQ-9: Recent Review Flowsheet Data    There is no flowsheet data to display.     Interpretation of Total Score  Total Score Depression Severity:  1-4 = Minimal depression, 5-9 = Mild depression, 10-14 = Moderate depression, 15-19 = Moderately severe depression, 20-27 = Severe depression   Psychosocial Evaluation and Intervention: Psychosocial Evaluation - 04/08/19 1737      Psychosocial Evaluation & Interventions   Interventions  Encouraged to exercise with the program and follow exercise prescription    Comments  Patient continues to deny psychosocial barriers to participation in CR. He maintains a positive outlook and attitude. He acknolodges a strong support system including family, friends, and health care team. He enjoys dancing and attending sporting events as a stress outlet.    Expected Outcomes  Patient will continue to maintain a positive attitude and outlook. He will participate in healthy stress management if psychosocial needs arise by utilizing  his support system and participating in his hobbies.    Continue Psychosocial Services   No Follow up required       Psychosocial Re-Evaluation:   Psychosocial Discharge (Final Psychosocial Re-Evaluation):   Vocational Rehabilitation: Provide vocational rehab assistance to qualifying candidates.   Vocational Rehab Evaluation & Intervention: Vocational Rehab - 04/02/19 1219      Initial Vocational Rehab Evaluation & Intervention   Assessment shows need for Vocational Rehabilitation  No       Education: Education Goals: Education classes will be provided on a weekly basis, covering required topics. Participant will state understanding/return demonstration of topics presented.  Learning Barriers/Preferences: Learning Barriers/Preferences - 04/02/19 1121      Learning Barriers/Preferences   Learning Barriers  Sight    Learning Preferences  Skilled Demonstration  Education Topics: Hypertension, Hypertension Reduction -Define heart disease and high blood pressure. Discus how high blood pressure affects the body and ways to reduce high blood pressure.   Exercise and Your Heart -Discuss why it is important to exercise, the FITT principles of exercise, normal and abnormal responses to exercise, and how to exercise safely.   Angina -Discuss definition of angina, causes of angina, treatment of angina, and how to decrease risk of having angina.   Cardiac Medications -Review what the following cardiac medications are used for, how they affect the body, and side effects that may occur when taking the medications.  Medications include Aspirin, Beta blockers, calcium channel blockers, ACE Inhibitors, angiotensin receptor blockers, diuretics, digoxin, and antihyperlipidemics.   Congestive Heart Failure -Discuss the definition of CHF, how to live with CHF, the signs and symptoms of CHF, and how keep track of weight and sodium intake.   Heart Disease and Intimacy -Discus the  effect sexual activity has on the heart, how changes occur during intimacy as we age, and safety during sexual activity.   Smoking Cessation / COPD -Discuss different methods to quit smoking, the health benefits of quitting smoking, and the definition of COPD.   Nutrition I: Fats -Discuss the types of cholesterol, what cholesterol does to the heart, and how cholesterol levels can be controlled.   Nutrition II: Labels -Discuss the different components of food labels and how to read food label   Heart Parts/Heart Disease and PAD -Discuss the anatomy of the heart, the pathway of blood circulation through the heart, and these are affected by heart disease.   Stress I: Signs and Symptoms -Discuss the causes of stress, how stress may lead to anxiety and depression, and ways to limit stress.   Stress II: Relaxation -Discuss different types of relaxation techniques to limit stress.   Warning Signs of Stroke / TIA -Discuss definition of a stroke, what the signs and symptoms are of a stroke, and how to identify when someone is having stroke.   Knowledge Questionnaire Score: Knowledge Questionnaire Score - 04/02/19 1120      Knowledge Questionnaire Score   Pre Score  21/24       Core Components/Risk Factors/Patient Goals at Admission: Personal Goals and Risk Factors at Admission - 04/02/19 1121      Core Components/Risk Factors/Patient Goals on Admission    Weight Management  Obesity;Yes    Intervention  Weight Management/Obesity: Establish reasonable short term and long term weight goals.;Weight Management: Develop a combined nutrition and exercise program designed to reach desired caloric intake, while maintaining appropriate intake of nutrient and fiber, sodium and fats, and appropriate energy expenditure required for the weight goal.;Weight Management: Provide education and appropriate resources to help participant work on and attain dietary goals.;Obesity: Provide education and  appropriate resources to help participant work on and attain dietary goals.    Admit Weight  197 lb 15.6 oz (89.8 kg)    Improve shortness of breath with ADL's  Yes    Intervention  Provide education, individualized exercise plan and daily activity instruction to help decrease symptoms of SOB with activities of daily living.    Expected Outcomes  Short Term: Improve cardiorespiratory fitness to achieve a reduction of symptoms when performing ADLs;Long Term: Be able to perform more ADLs without symptoms or delay the onset of symptoms    Hypertension  Yes    Intervention  Provide education on lifestyle modifcations including regular physical activity/exercise, weight management, moderate sodium restriction and increased consumption  of fresh fruit, vegetables, and low fat dairy, alcohol moderation, and smoking cessation.;Monitor prescription use compliance.    Expected Outcomes  Short Term: Continued assessment and intervention until BP is < 140/15mm HG in hypertensive participants. < 130/31mm HG in hypertensive participants with diabetes, heart failure or chronic kidney disease.;Long Term: Maintenance of blood pressure at goal levels.    Lipids  Yes    Intervention  Provide education and support for participant on nutrition & aerobic/resistive exercise along with prescribed medications to achieve LDL 70mg , HDL >40mg .    Expected Outcomes  Short Term: Participant states understanding of desired cholesterol values and is compliant with medications prescribed. Participant is following exercise prescription and nutrition guidelines.;Long Term: Cholesterol controlled with medications as prescribed, with individualized exercise RX and with personalized nutrition plan. Value goals: LDL < 70mg , HDL > 40 mg.       Core Components/Risk Factors/Patient Goals Review:  Goals and Risk Factor Review    Row Name 04/08/19 1741             Core Components/Risk Factors/Patient Goals Review   Personal Goals Review   Weight Management/Obesity;Lipids;Improve shortness of breath with ADL's;Hypertension       Review  Patient with multiple CAD risk factors. He is eager to modify by participating in the CR exercise and education program.       Expected Outcomes  Patient will continue to participate in CR with steady attendance to modify risk factors, improve nutrition, and learn other lifestyle modifications that can improve his health and decrease heart disease. His personal goals include improving his knowledge of heart healthy eating and decreasing his shortness of breath with exertion. He also wishes to improve his ability to participate in physical activities.          Core Components/Risk Factors/Patient Goals at Discharge (Final Review):  Goals and Risk Factor Review - 04/08/19 1741      Core Components/Risk Factors/Patient Goals Review   Personal Goals Review  Weight Management/Obesity;Lipids;Improve shortness of breath with ADL's;Hypertension    Review  Patient with multiple CAD risk factors. He is eager to modify by participating in the CR exercise and education program.    Expected Outcomes  Patient will continue to participate in CR with steady attendance to modify risk factors, improve nutrition, and learn other lifestyle modifications that can improve his health and decrease heart disease. His personal goals include improving his knowledge of heart healthy eating and decreasing his shortness of breath with exertion. He also wishes to improve his ability to participate in physical activities.       ITP Comments: ITP Comments    Row Name 04/02/19 0946 04/08/19 1736         ITP Comments  Medical Director- Dr. Fransico Him, MD  30 day ITP review: Patient completed his first CR exercise session today and tolerated well. Denied complaints. VSS         Comments: See ITP comments

## 2019-04-12 ENCOUNTER — Encounter (HOSPITAL_COMMUNITY)
Admission: RE | Admit: 2019-04-12 | Discharge: 2019-04-12 | Disposition: A | Discharge status: Home / Self Care | Payer: Medicare Other | Source: Ambulatory Visit | Attending: Cardiology | Admitting: Cardiology

## 2019-04-12 ENCOUNTER — Other Ambulatory Visit: Payer: Self-pay

## 2019-04-12 DIAGNOSIS — Z48812 Encounter for surgical aftercare following surgery on the circulatory system: Secondary | ICD-10-CM | POA: Diagnosis not present

## 2019-04-12 DIAGNOSIS — Z955 Presence of coronary angioplasty implant and graft: Secondary | ICD-10-CM

## 2019-04-15 ENCOUNTER — Encounter (HOSPITAL_COMMUNITY)
Admission: RE | Admit: 2019-04-15 | Discharge: 2019-04-15 | Disposition: A | Discharge status: Home / Self Care | Payer: Medicare Other | Source: Ambulatory Visit | Attending: Cardiology | Admitting: Cardiology

## 2019-04-15 ENCOUNTER — Other Ambulatory Visit: Payer: Self-pay

## 2019-04-15 DIAGNOSIS — Z48812 Encounter for surgical aftercare following surgery on the circulatory system: Secondary | ICD-10-CM | POA: Diagnosis not present

## 2019-04-15 DIAGNOSIS — Z955 Presence of coronary angioplasty implant and graft: Secondary | ICD-10-CM

## 2019-04-17 ENCOUNTER — Encounter (HOSPITAL_COMMUNITY): Payer: Medicare Other

## 2019-04-19 ENCOUNTER — Other Ambulatory Visit: Payer: Self-pay

## 2019-04-19 ENCOUNTER — Encounter (HOSPITAL_COMMUNITY)
Admission: RE | Admit: 2019-04-19 | Discharge: 2019-04-19 | Disposition: A | Discharge status: Home / Self Care | Payer: Medicare Other | Source: Ambulatory Visit | Attending: Cardiology | Admitting: Cardiology

## 2019-04-19 DIAGNOSIS — Z48812 Encounter for surgical aftercare following surgery on the circulatory system: Secondary | ICD-10-CM | POA: Diagnosis not present

## 2019-04-19 DIAGNOSIS — Z955 Presence of coronary angioplasty implant and graft: Secondary | ICD-10-CM

## 2019-04-19 NOTE — Progress Notes (Signed)
   04/19/19 0753  Exercise Goal Re-Evaluation  Exercise Goals Review Able to understand and use rate of perceived exertion (RPE) scale;Increase Physical Activity;Understanding of Exercise Prescription  Comments Reviewed home exercise guidelines with patient including THRR, RPE scale, and endpoints for exercise. Reviewed pulse counting, pt states he knows how to find his pulse, handout given. Pt states he's walking 15 minutes, 3 days/week in addition to his exercise at cardiac rehab.Discussed increasing duration to achieve 30 minutes on his 3 days with a goal of achieving 150 minutes of exercise per week.  Expected Outcomes Patient will  increase walking duration at home to accumulate 150 minutes of aerobic exercise activity per week.

## 2019-04-22 ENCOUNTER — Other Ambulatory Visit: Payer: Self-pay

## 2019-04-22 ENCOUNTER — Encounter (HOSPITAL_COMMUNITY)
Admission: RE | Admit: 2019-04-22 | Discharge: 2019-04-22 | Disposition: A | Discharge status: Home / Self Care | Payer: Medicare Other | Source: Ambulatory Visit | Attending: Cardiology | Admitting: Cardiology

## 2019-04-22 DIAGNOSIS — Z955 Presence of coronary angioplasty implant and graft: Secondary | ICD-10-CM

## 2019-04-22 DIAGNOSIS — Z48812 Encounter for surgical aftercare following surgery on the circulatory system: Secondary | ICD-10-CM | POA: Diagnosis not present

## 2019-04-23 ENCOUNTER — Telehealth (HOSPITAL_COMMUNITY): Payer: Self-pay | Admitting: Family Medicine

## 2019-04-24 ENCOUNTER — Encounter (HOSPITAL_COMMUNITY): Payer: Medicare Other

## 2019-04-26 ENCOUNTER — Other Ambulatory Visit: Payer: Self-pay

## 2019-04-26 ENCOUNTER — Encounter (HOSPITAL_COMMUNITY)
Admission: RE | Admit: 2019-04-26 | Discharge: 2019-04-26 | Disposition: A | Discharge status: Home / Self Care | Payer: Medicare Other | Source: Ambulatory Visit | Attending: Cardiology | Admitting: Cardiology

## 2019-04-26 DIAGNOSIS — Z48812 Encounter for surgical aftercare following surgery on the circulatory system: Secondary | ICD-10-CM | POA: Diagnosis not present

## 2019-04-26 DIAGNOSIS — Z955 Presence of coronary angioplasty implant and graft: Secondary | ICD-10-CM

## 2019-04-26 NOTE — Progress Notes (Signed)
Nutrition note Spoke with patient today about a heart healthy diet. Reviewed lipid panel and blood sugars. Reviewed label reading and mediterranean diet. Food label recommendations of >2 g fiber, <2g saturated fat,0 g trans fat, and <140 mg sodium.Recommended eating pattern of lean protein at every meal or snack, 2 servings fatty fish per week, 3-5 servings non starchy vegetables per day, 2-3 servings fruit per day, 1 oz nuts per day. Provided handouts and recipes. Collaborated with pt for goal setting. Pt verbalized understanding.  Michaele Offer, MS, RDN, LDN

## 2019-04-29 ENCOUNTER — Encounter (HOSPITAL_COMMUNITY)
Admission: RE | Admit: 2019-04-29 | Discharge: 2019-04-29 | Disposition: A | Discharge status: Home / Self Care | Payer: Medicare Other | Source: Ambulatory Visit | Attending: Cardiology | Admitting: Cardiology

## 2019-04-29 ENCOUNTER — Other Ambulatory Visit: Payer: Self-pay

## 2019-04-29 DIAGNOSIS — E785 Hyperlipidemia, unspecified: Secondary | ICD-10-CM | POA: Diagnosis not present

## 2019-04-29 DIAGNOSIS — Z48812 Encounter for surgical aftercare following surgery on the circulatory system: Secondary | ICD-10-CM | POA: Insufficient documentation

## 2019-04-29 DIAGNOSIS — Z79899 Other long term (current) drug therapy: Secondary | ICD-10-CM | POA: Diagnosis not present

## 2019-04-29 DIAGNOSIS — I10 Essential (primary) hypertension: Secondary | ICD-10-CM | POA: Diagnosis not present

## 2019-04-29 DIAGNOSIS — Z7902 Long term (current) use of antithrombotics/antiplatelets: Secondary | ICD-10-CM | POA: Diagnosis not present

## 2019-04-29 DIAGNOSIS — Z955 Presence of coronary angioplasty implant and graft: Secondary | ICD-10-CM | POA: Insufficient documentation

## 2019-04-29 DIAGNOSIS — I2510 Atherosclerotic heart disease of native coronary artery without angina pectoris: Secondary | ICD-10-CM | POA: Diagnosis not present

## 2019-04-29 DIAGNOSIS — Z7982 Long term (current) use of aspirin: Secondary | ICD-10-CM | POA: Insufficient documentation

## 2019-05-01 ENCOUNTER — Encounter (HOSPITAL_COMMUNITY)
Admission: RE | Admit: 2019-05-01 | Discharge: 2019-05-01 | Disposition: A | Discharge status: Home / Self Care | Payer: Medicare Other | Source: Ambulatory Visit | Attending: Cardiology | Admitting: Cardiology

## 2019-05-01 ENCOUNTER — Other Ambulatory Visit: Payer: Self-pay

## 2019-05-01 VITALS — Ht 67.25 in | Wt 196.4 lb

## 2019-05-01 DIAGNOSIS — Z48812 Encounter for surgical aftercare following surgery on the circulatory system: Secondary | ICD-10-CM | POA: Diagnosis not present

## 2019-05-01 DIAGNOSIS — Z955 Presence of coronary angioplasty implant and graft: Secondary | ICD-10-CM

## 2019-05-03 ENCOUNTER — Other Ambulatory Visit: Payer: Self-pay

## 2019-05-03 ENCOUNTER — Encounter (HOSPITAL_COMMUNITY)
Admission: RE | Admit: 2019-05-03 | Discharge: 2019-05-03 | Disposition: A | Discharge status: Home / Self Care | Payer: Medicare Other | Source: Ambulatory Visit | Attending: Cardiology | Admitting: Cardiology

## 2019-05-03 DIAGNOSIS — Z955 Presence of coronary angioplasty implant and graft: Secondary | ICD-10-CM

## 2019-05-03 DIAGNOSIS — Z48812 Encounter for surgical aftercare following surgery on the circulatory system: Secondary | ICD-10-CM | POA: Diagnosis not present

## 2019-05-06 ENCOUNTER — Encounter (HOSPITAL_COMMUNITY)
Admission: RE | Admit: 2019-05-06 | Discharge: 2019-05-06 | Disposition: A | Discharge status: Home / Self Care | Payer: Medicare Other | Source: Ambulatory Visit | Attending: Cardiology | Admitting: Cardiology

## 2019-05-06 ENCOUNTER — Other Ambulatory Visit: Payer: Self-pay

## 2019-05-06 DIAGNOSIS — Z48812 Encounter for surgical aftercare following surgery on the circulatory system: Secondary | ICD-10-CM | POA: Diagnosis not present

## 2019-05-06 DIAGNOSIS — Z955 Presence of coronary angioplasty implant and graft: Secondary | ICD-10-CM

## 2019-05-08 ENCOUNTER — Other Ambulatory Visit: Payer: Self-pay

## 2019-05-08 ENCOUNTER — Encounter (HOSPITAL_COMMUNITY)
Admission: RE | Admit: 2019-05-08 | Discharge: 2019-05-08 | Disposition: A | Discharge status: Home / Self Care | Payer: Medicare Other | Source: Ambulatory Visit | Attending: Cardiology | Admitting: Cardiology

## 2019-05-08 DIAGNOSIS — Z955 Presence of coronary angioplasty implant and graft: Secondary | ICD-10-CM

## 2019-05-08 DIAGNOSIS — Z48812 Encounter for surgical aftercare following surgery on the circulatory system: Secondary | ICD-10-CM | POA: Diagnosis not present

## 2019-05-09 NOTE — Progress Notes (Signed)
Cardiac Individual Treatment Plan  Patient Details  Name: Bradley Parker MRN: [id number] Date of Birth: [birth year] Referring Provider:     CARDIAC REHAB PHASE II ORIENTATION from 04/02/2019 in [facility] CARDIAC REHAB  Referring Provider  Dr Yates Decamp MD      Initial Encounter Date:    CARDIAC REHAB PHASE II ORIENTATION from 04/02/2019 in [facility] CARDIAC REHAB  Date  04/02/19      Visit Diagnosis: 03/12/19 DES LAD  Patient's Home Medications on Admission:  Current Outpatient Medications:  .  aspirin EC 81 MG tablet, Take 81 mg by mouth daily. , Disp: , Rfl:  .  clopidogrel (PLAVIX) 75 MG tablet, Take 1 tablet (75 mg total) by mouth daily., Disp: 90 tablet, Rfl: 2 .  diphenhydrAMINE (BENADRYL) 25 MG tablet, Take 25 mg by mouth daily., Disp: , Rfl:  .  dutasteride (AVODART) 0.5 MG capsule, Take 0.5 mg by mouth daily. , Disp: , Rfl:  .  latanoprost (XALATAN) 0.005 % ophthalmic solution, Place 1 drop into both eyes at bedtime., Disp: , Rfl:  .  losartan-hydrochlorothiazide (HYZAAR) 100-25 MG tablet, Take 1 tablet by mouth daily., Disp: 90 tablet, Rfl: 2 .  metoprolol succinate (TOPROL XL) 25 MG 24 hr tablet, Take 1 tablet (25 mg total) by mouth daily., Disp: 30 tablet, Rfl: 11 .  nitroGLYCERIN (NITROSTAT) 0.4 MG SL tablet, Place 1 tablet (0.4 mg total) under the tongue every 5 (five) minutes as needed for up to 25 days for chest pain. (Patient not taking: Reported on 03/23/2019), Disp: 25 tablet, Rfl: 3 .  rosuvastatin (CRESTOR) 20 MG tablet, Take 1 tablet (20 mg total) by mouth daily., Disp: 90 tablet, Rfl: 1 .  timolol (TIMOPTIC) 0.5 % ophthalmic solution, Place 1 drop into both eyes daily., Disp: , Rfl:  .  vitamin C (ASCORBIC ACID) 500 MG tablet, Take 500 mg by mouth daily., Disp: , Rfl:  .  Vitamin D, Cholecalciferol, 25 MCG (1000 UT) TABS, Take 1,000 Units by mouth daily. , Disp: , Rfl:   Past Medical History: Past Medical History:  Diagnosis  Date  . Coronary artery disease   . HTN (hypertension) 01/04/2019  . Hyperlipidemia     Tobacco Use: Social History   Tobacco Use  Smoking Status Never Smoker  Smokeless Tobacco Never Used    Labs: Recent Review Flowsheet Data    There is no flowsheet data to display.      Capillary Blood Glucose: Lab Results  Component Value Date   GLUCAP 113 (H) 04/08/2019     Exercise Target Goals: Exercise Program Goal: Individual exercise prescription set using results from initial 6 min walk test and THRR while considering  patient's activity barriers and safety.   Exercise Prescription Goal: Initial exercise prescription builds to 30-45 minutes a day of aerobic activity, 2-3 days per week.  Home exercise guidelines will be given to patient during program as part of exercise prescription that the participant will acknowledge.  Activity Barriers & Risk Stratification: Activity Barriers & Cardiac Risk Stratification - 04/02/19 1112      Activity Barriers & Cardiac Risk Stratification   Activity Barriers  None    Cardiac Risk Stratification  Low       6 Minute Walk: 6 Minute Walk    Row Name 04/02/19 1020         6 Minute Walk   Phase  Initial     Distance  1450 feet  Walk Time  6 minutes     # of Rest Breaks  0     MPH  2.75     METS  3.12     RPE  9     Perceived Dyspnea   0     VO2 Peak  10.93     Symptoms  No     Resting HR  81 bpm     Resting BP  108/72     Resting Oxygen Saturation   99 %     Exercise Oxygen Saturation  during 6 min walk  99 %     Max Ex. HR  100 bpm     Max Ex. BP  114/78     2 Minute Post BP  98/70        Oxygen Initial Assessment:   Oxygen Re-Evaluation:   Oxygen Discharge (Final Oxygen Re-Evaluation):   Initial Exercise Prescription: Initial Exercise Prescription - 04/02/19 1100      Date of Initial Exercise RX and Referring Provider   Date  04/02/19    Referring Provider  Dr Adrian Prows MD    Expected Discharge Date   05/31/19      Treadmill   MPH  2.8    Grade  0    Minutes  15    METs  3.14      NuStep   Level  3    SPM  85    Minutes  15    METs  3      Prescription Details   Frequency (times per week)  3    Duration  Progress to 30 minutes of continuous aerobic without signs/symptoms of physical distress      Intensity   THRR 40-80% of Max Heartrate  62-124    Ratings of Perceived Exertion  11-13    Perceived Dyspnea  0-4      Progression   Progression  Continue to progress workloads to maintain intensity without signs/symptoms of physical distress.      Resistance Training   Training Prescription  Yes    Weight  4lbs    Reps  10-15       Perform Capillary Blood Glucose checks as needed.  Exercise Prescription Changes: Exercise Prescription Changes    Row Name 04/08/19 0717 04/19/19 0718 04/22/19 0719         Response to Exercise   Blood Pressure (Admit)  100/70  98/72  108/70     Blood Pressure (Exercise)  138/82  172/88  150/76     Blood Pressure (Exit)  98/70  100/64  114/72     Heart Rate (Admit)  74 bpm  83 bpm  67 bpm     Heart Rate (Exercise)  125 bpm  126 bpm  111 bpm     Heart Rate (Exit)  84 bpm  83 bpm  77 bpm     Rating of Perceived Exertion (Exercise)  11  13  11      Symptoms  none  none  none     Comments  Off to a good start with exercise.  -  -     Duration  Progress to 30 minutes of  aerobic without signs/symptoms of physical distress  Progress to 30 minutes of  aerobic without signs/symptoms of physical distress  Progress to 30 minutes of  aerobic without signs/symptoms of physical distress     Intensity  THRR unchanged  THRR unchanged  THRR unchanged  Progression   Progression  Continue to progress workloads to maintain intensity without signs/symptoms of physical distress.  Continue to progress workloads to maintain intensity without signs/symptoms of physical distress.  Continue to progress workloads to maintain intensity without signs/symptoms  of physical distress.     Average METs  3.5  4.5  3.9       Resistance Training   Training Prescription  Yes  Yes  Yes     Weight  4lbs  4lbs  4lbs     Reps  10-15  10-15  10-15     Time  10 Minutes  10 Minutes  10 Minutes       Interval Training   Interval Training  No  No  No       Treadmill   MPH  2.8  3  3      Grade  0  1  1     Minutes  15  15  15      METs  3.14  3.71  3.71       NuStep   Level  4 Increased WL  5 Increased WL  5     SPM  85  85  85     Minutes  15  15  15      METs  3.8  5.3  4.1       Home Exercise Plan   Plans to continue exercise at  -  Home (comment) Walking  Home (comment) Walking     Frequency  -  Add 3 additional days to program exercise sessions.  Add 3 additional days to program exercise sessions.     Initial Home Exercises Provided  -  04/19/19  04/19/19        Exercise Comments: Exercise Comments    Row Name 04/08/19 0815 04/19/19 0753         Exercise Comments  Patient tolerated 1st exercise session well without symptoms.  Reviewed home exercise gudielines with patient.         Exercise Goals and Review: Exercise Goals    Row Name 04/02/19 1006             Exercise Goals   Increase Physical Activity  Yes       Intervention  Provide advice, education, support and counseling about physical activity/exercise needs.;Develop an individualized exercise prescription for aerobic and resistive training based on initial evaluation findings, risk stratification, comorbidities and participant's personal goals.       Expected Outcomes  Short Term: Attend rehab on a regular basis to increase amount of physical activity.;Long Term: Exercising regularly at least 3-5 days a week.;Long Term: Add in home exercise to make exercise part of routine and to increase amount of physical activity.       Increase Strength and Stamina  Yes       Intervention  Provide advice, education, support and counseling about physical activity/exercise needs.;Develop an  individualized exercise prescription for aerobic and resistive training based on initial evaluation findings, risk stratification, comorbidities and participant's personal goals.       Expected Outcomes  Short Term: Increase workloads from initial exercise prescription for resistance, speed, and METs.;Short Term: Perform resistance training exercises routinely during rehab and add in resistance training at home;Long Term: Improve cardiorespiratory fitness, muscular endurance and strength as measured by increased METs and functional capacity ( )       Able to understand and use rate of perceived exertion (RPE) scale  Yes  Intervention  Provide education and explanation on how to use RPE scale       Expected Outcomes  Short Term: Able to use RPE daily in rehab to express subjective intensity level;Long Term:  Able to use RPE to guide intensity level when exercising independently       Knowledge and understanding of Target Heart Rate Range (THRR)  Yes       Intervention  Provide education and explanation of THRR including how the numbers were predicted and where they are located for reference       Expected Outcomes  Short Term: Able to state/look up THRR;Long Term: Able to use THRR to govern intensity when exercising independently;Short Term: Able to use daily as guideline for intensity in rehab       Able to check pulse independently  Yes       Intervention  Provide education and demonstration on how to check pulse in carotid and radial arteries.;Review the importance of being able to check your own pulse for safety during independent exercise       Expected Outcomes  Short Term: Able to explain why pulse checking is important during independent exercise;Long Term: Able to check pulse independently and accurately       Understanding of Exercise Prescription  Yes       Intervention  Provide education, explanation, and written materials on patient's individual exercise prescription       Expected  Outcomes  Short Term: Able to explain program exercise prescription;Long Term: Able to explain home exercise prescription to exercise independently          Exercise Goals Re-Evaluation : Exercise Goals Re-Evaluation    Row Name 04/08/19 0815 04/19/19 0753           Exercise Goal Re-Evaluation   Exercise Goals Review  Able to understand and use rate of perceived exertion (RPE) scale;Increase Physical Activity  Able to understand and use rate of perceived exertion (RPE) scale;Increase Physical Activity;Understanding of Exercise Prescription      Comments  Patient able to understand and use RPE scale appropriately.  Reviewed home exercise guidelines with patient including THRR, RPE scale, and endpoints for exercise. Reviewed pulse counting, pt states he knows how to find his pulse, handout given. Pt states he's walking 15 minutes, 3 days/week in addition to his exercise at cardiac rehab.Discussed increasing duration to achieve 30 minutes on his 3 days with a goal of achieving 150 minutes of exercise per week.      Expected Outcomes  Increase workloads as tolerated to help increase cardiorespiratory fitness.  Patient will  increase walking duration at home to accumulate 150 minutes of aerobic exercise activity per week.         Discharge Exercise Prescription (Final Exercise Prescription Changes): Exercise Prescription Changes - 04/22/19 0719      Response to Exercise   Blood Pressure (Admit)  108/70    Blood Pressure (Exercise)  150/76    Blood Pressure (Exit)  114/72    Heart Rate (Admit)  67 bpm    Heart Rate (Exercise)  111 bpm    Heart Rate (Exit)  77 bpm    Rating of Perceived Exertion (Exercise)  11    Symptoms  none    Duration  Progress to 30 minutes of  aerobic without signs/symptoms of physical distress    Intensity  THRR unchanged      Progression   Progression  Continue to progress workloads to maintain intensity without signs/symptoms of  physical distress.    Average  METs  3.9      Resistance Training   Training Prescription  Yes    Weight  4lbs    Reps  10-15    Time  10 Minutes      Interval Training   Interval Training  No      Treadmill   MPH  3    Grade  1    Minutes  15    METs  3.71      NuStep   Level  5    SPM  85    Minutes  15    METs  4.1      Home Exercise Plan   Plans to continue exercise at  Home (comment)   Walking   Frequency  Add 3 additional days to program exercise sessions.    Initial Home Exercises Provided  04/19/19       Nutrition:  Target Goals: Understanding of nutrition guidelines, daily intake of sodium 1500mg , cholesterol 200mg , calories 30% from fat and 7% or less from saturated fats, daily to have 5 or more servings of fruits and vegetables.  Biometrics: Pre Biometrics - 04/02/19 1053      Pre Biometrics   Height  5' 7.25" (1.708 m)    Weight  89.8 kg    Waist Circumference  39.5 inches    Hip Circumference  45 inches    Waist to Hip Ratio  0.88 %    BMI (Calculated)  30.78    Triceps Skinfold  11 mm    % Body Fat  27.2 %    Grip Strength  43.5 kg    Flexibility  20 in    Single Leg Stand  21.5 seconds        Nutrition Therapy Plan and Nutrition Goals: Nutrition Therapy & Goals - 05/07/19 1121      Nutrition Therapy   Diet  Mediterranean    Drug/Food Interactions  Statins/Certain Fruits      Personal Nutrition Goals   Nutrition Goal  Pt to build a healthy plate including vegetables, fruits, whole grains, and low-fat dairy products in a heart healthy meal plan.      Intervention Plan   Intervention  Prescribe, educate and counsel regarding individualized specific dietary modifications aiming towards targeted core components such as weight, hypertension, lipid management, diabetes, heart failure and other comorbidities.;Nutrition handout(s) given to patient.    Expected Outcomes  Short Term Goal: Understand basic principles of dietary content, such as calories, fat, sodium,  cholesterol and nutrients.;Long Term Goal: Adherence to prescribed nutrition plan.       Nutrition Assessments:   Nutrition Goals Re-Evaluation: Nutrition Goals Re-Evaluation    Row Name 05/07/19 1122             Goals   Current Weight  196 lb 6.9 oz (89.1 kg)       Nutrition Goal  Pt to build a healthy plate including vegetables, fruits, whole grains, and low-fat dairy products in a heart healthy meal plan.          Nutrition Goals Re-Evaluation: Nutrition Goals Re-Evaluation    Row Name 05/07/19 1122             Goals   Current Weight  196 lb 6.9 oz (89.1 kg)       Nutrition Goal  Pt to build a healthy plate including vegetables, fruits, whole grains, and low-fat dairy products in a heart healthy meal plan.  Nutrition Goals Discharge (Final Nutrition Goals Re-Evaluation): Nutrition Goals Re-Evaluation - 05/07/19 1122      Goals   Current Weight  196 lb 6.9 oz (89.1 kg)    Nutrition Goal  Pt to build a healthy plate including vegetables, fruits, whole grains, and low-fat dairy products in a heart healthy meal plan.       Psychosocial: Target Goals: Acknowledge presence or absence of significant depression and/or stress, maximize coping skills, provide positive support system. Participant is able to verbalize types and ability to use techniques and skills needed for reducing stress and depression.  Initial Review & Psychosocial Screening: Initial Psych Review & Screening - 04/02/19 1218      Initial Review   Current issues with  None Identified      Family Dynamics   Good Support System?  Yes   Stick has his wife for support     Barriers   Psychosocial barriers to participate in program  There are no identifiable barriers or psychosocial needs.      Screening Interventions   Interventions  Encouraged to exercise       Quality of Life Scores: Quality of Life - 04/02/19 1118      Quality of Life   Select  Quality of Life      Quality of  Life Scores   Health/Function Pre  24.67 %    Socioeconomic Pre  27.86 %    Psych/Spiritual Pre  25.5 %    Family Pre  30 %    GLOBAL Pre  26.28 %      Scores of 19 and below usually indicate a poorer quality of life in these areas.  A difference of  2-3 points is a clinically meaningful difference.  A difference of 2-3 points in the total score of the Quality of Life Index has been associated with significant improvement in overall quality of life, self-image, physical symptoms, and general health in studies assessing change in quality of life.  PHQ-9: Recent Review Flowsheet Data    There is no flowsheet data to display.     Interpretation of Total Score  Total Score Depression Severity:  1-4 = Minimal depression, 5-9 = Mild depression, 10-14 = Moderate depression, 15-19 = Moderately severe depression, 20-27 = Severe depression   Psychosocial Evaluation and Intervention: Psychosocial Evaluation - 04/08/19 1737      Psychosocial Evaluation & Interventions   Interventions  Encouraged to exercise with the program and follow exercise prescription    Comments  Patient continues to deny psychosocial barriers to participation in CR. He maintains a positive outlook and attitude. He acknolodges a strong support system including family, friends, and health care team. He enjoys dancing and attending sporting events as a stress outlet.    Expected Outcomes  Patient will continue to maintain a positive attitude and outlook. He will participate in healthy stress management if psychosocial needs arise by utilizing his support system and participating in his hobbies.    Continue Psychosocial Services   No Follow up required       Psychosocial Re-Evaluation: Psychosocial Re-Evaluation    Row Name 05/03/19 1609             Psychosocial Re-Evaluation   Current issues with  None Identified       Comments  No psychosocial interventions necessary.       Expected Outcomes  [place] will continue  to maintain a positive outlook with good coping skills.  Interventions  Encouraged to attend Cardiac Rehabilitation for the exercise       Continue Psychosocial Services   No Follow up required          Psychosocial Discharge (Final Psychosocial Re-Evaluation): Psychosocial Re-Evaluation - 05/03/19 1609      Psychosocial Re-Evaluation   Current issues with  None Identified    Comments  No psychosocial interventions necessary.    Expected Outcomes  Janann AugustUlysses will continue to maintain a positive outlook with good coping skills.    Interventions  Encouraged to attend Cardiac Rehabilitation for the exercise    Continue Psychosocial Services   No Follow up required       Vocational Rehabilitation: Provide vocational rehab assistance to qualifying candidates.   Vocational Rehab Evaluation & Intervention: Vocational Rehab - 04/02/19 1219      Initial Vocational Rehab Evaluation & Intervention   Assessment shows need for Vocational Rehabilitation  No       Education: Education Goals: Education classes will be provided on a weekly basis, covering required topics. Participant will state understanding/return demonstration of topics presented.  Learning Barriers/Preferences: Learning Barriers/Preferences - 04/02/19 1121      Learning Barriers/Preferences   Learning Barriers  Sight    Learning Preferences  Skilled Demonstration       Education Topics: Count Your Pulse:  -Group instruction provided by verbal instruction, demonstration, patient participation and written materials to support subject.  Instructors address importance of being able to find your pulse and how to count your pulse when at home without a heart monitor.  Patients get hands on experience counting their pulse with staff help and individually.   Heart Attack, Angina, and Risk Factor Modification:  -Group instruction provided by verbal instruction, video, and written materials to support subject.  Instructors  address signs and symptoms of angina and heart attacks.    Also discuss risk factors for heart disease and how to make changes to improve heart health risk factors.   Functional Fitness:  -Group instruction provided by verbal instruction, demonstration, patient participation, and written materials to support subject.  Instructors address safety measures for doing things around the house.  Discuss how to get up and down off the floor, how to pick things up properly, how to safely get out of a chair without assistance, and balance training.   Meditation and Mindfulness:  -Group instruction provided by verbal instruction, patient participation, and written materials to support subject.  Instructor addresses importance of mindfulness and meditation practice to help reduce stress and improve awareness.  Instructor also leads participants through a meditation exercise.    Stretching for Flexibility and Mobility:  -Group instruction provided by verbal instruction, patient participation, and written materials to support subject.  Instructors lead participants through series of stretches that are designed to increase flexibility thus improving mobility.  These stretches are additional exercise for major muscle groups that are typically performed during regular warm up and cool down.   Hands Only CPR:  -Group verbal, video, and participation provides a basic overview of AHA guidelines for community CPR. Role-play of emergencies allow participants the opportunity to practice calling for help and chest compression technique with discussion of AED use.   Hypertension: -Group verbal and written instruction that provides a basic overview of hypertension including the most recent diagnostic guidelines, risk factor reduction with self-care instructions and medication management.    Nutrition I class: Heart Healthy Eating:  -Group instruction provided by PowerPoint slides, verbal discussion, and written  materials to  support subject matter. The instructor gives an explanation and review of the Therapeutic Lifestyle Changes diet recommendations, which includes a discussion on lipid goals, dietary fat, sodium, fiber, plant stanol/sterol esters, sugar, and the components of a well-balanced, healthy diet.   Nutrition II class: Lifestyle Skills:  -Group instruction provided by PowerPoint slides, verbal discussion, and written materials to support subject matter. The instructor gives an explanation and review of label reading, grocery shopping for heart health, heart healthy recipe modifications, and ways to make healthier choices when eating out.   Diabetes Question & Answer:  -Group instruction provided by PowerPoint slides, verbal discussion, and written materials to support subject matter. The instructor gives an explanation and review of diabetes co-morbidities, pre- and post-prandial blood glucose goals, pre-exercise blood glucose goals, signs, symptoms, and treatment of hypoglycemia and hyperglycemia, and foot care basics.   Diabetes Blitz:  -Group instruction provided by PowerPoint slides, verbal discussion, and written materials to support subject matter. The instructor gives an explanation and review of the physiology behind type 1 and type 2 diabetes, diabetes medications and rational behind using different medications, pre- and post-prandial blood glucose recommendations and Hemoglobin A1c goals, diabetes diet, and exercise including blood glucose guidelines for exercising safely.    Portion Distortion:  -Group instruction provided by PowerPoint slides, verbal discussion, written materials, and food models to support subject matter. The instructor gives an explanation of serving size versus portion size, changes in portions sizes over the last 20 years, and what consists of a serving from each food group.   Stress Management:  -Group instruction provided by verbal instruction, video, and  written materials to support subject matter.  Instructors review role of stress in heart disease and how to cope with stress positively.     Exercising on Your Own:  -Group instruction provided by verbal instruction, power point, and written materials to support subject.  Instructors discuss benefits of exercise, components of exercise, frequency and intensity of exercise, and end points for exercise.  Also discuss use of nitroglycerin and activating EMS.  Review options of places to exercise outside of rehab.  Review guidelines for sex with heart disease.   Cardiac Drugs I:  -Group instruction provided by verbal instruction and written materials to support subject.  Instructor reviews cardiac drug classes: antiplatelets, anticoagulants, beta blockers, and statins.  Instructor discusses reasons, side effects, and lifestyle considerations for each drug class.   Cardiac Drugs II:  -Group instruction provided by verbal instruction and written materials to support subject.  Instructor reviews cardiac drug classes: angiotensin converting enzyme inhibitors (ACE-I), angiotensin II receptor blockers (ARBs), nitrates, and calcium channel blockers.  Instructor discusses reasons, side effects, and lifestyle considerations for each drug class.   Anatomy and Physiology of the Circulatory System:  Group verbal and written instruction and models provide basic cardiac anatomy and physiology, with the coronary electrical and arterial systems. Review of: AMI, Angina, Valve disease, Heart Failure, Peripheral Artery Disease, Cardiac Arrhythmia, Pacemakers, and the ICD.   Other Education:  -Group or individual verbal, written, or video instructions that support the educational goals of the cardiac rehab program.   Holiday Eating Survival Tips:  -Group instruction provided by PowerPoint slides, verbal discussion, and written materials to support subject matter. The instructor gives patients tips, tricks, and  techniques to help them not only survive but enjoy the holidays despite the onslaught of food that accompanies the holidays.   Knowledge Questionnaire Score: Knowledge Questionnaire Score - 04/02/19 1120  Knowledge Questionnaire Score   Pre Score  21/24       Core Components/Risk Factors/Patient Goals at Admission: Personal Goals and Risk Factors at Admission - 04/02/19 1121      Core Components/Risk Factors/Patient Goals on Admission    Weight Management  Obesity;Yes    Intervention  Weight Management/Obesity: Establish reasonable short term and long term weight goals.;Weight Management: Develop a combined nutrition and exercise program designed to reach desired caloric intake, while maintaining appropriate intake of nutrient and fiber, sodium and fats, and appropriate energy expenditure required for the weight goal.;Weight Management: Provide education and appropriate resources to help participant work on and attain dietary goals.;Obesity: Provide education and appropriate resources to help participant work on and attain dietary goals.    Admit Weight  197 lb 15.6 oz (89.8 kg)    Improve shortness of breath with ADL's  Yes    Intervention  Provide education, individualized exercise plan and daily activity instruction to help decrease symptoms of SOB with activities of daily living.    Expected Outcomes  Short Term: Improve cardiorespiratory fitness to achieve a reduction of symptoms when performing ADLs;Long Term: Be able to perform more ADLs without symptoms or delay the onset of symptoms    Hypertension  Yes    Intervention  Provide education on lifestyle modifcations including regular physical activity/exercise, weight management, moderate sodium restriction and increased consumption of fresh fruit, vegetables, and low fat dairy, alcohol moderation, and smoking cessation.;Monitor prescription use compliance.    Expected Outcomes  Short Term: Continued assessment and intervention until  BP is < 140/65mm HG in hypertensive participants. < 130/48mm HG in hypertensive participants with diabetes, heart failure or chronic kidney disease.;Long Term: Maintenance of blood pressure at goal levels.    Lipids  Yes    Intervention  Provide education and support for participant on nutrition & aerobic/resistive exercise along with prescribed medications to achieve LDL 70mg , HDL >40mg .    Expected Outcomes  Short Term: Participant states understanding of desired cholesterol values and is compliant with medications prescribed. Participant is following exercise prescription and nutrition guidelines.;Long Term: Cholesterol controlled with medications as prescribed, with individualized exercise RX and with personalized nutrition plan. Value goals: LDL < , HDL > 40 mg.       Core Components/Risk Factors/Patient Goals Review:  Goals and Risk Factor Review    Row Name 04/08/19 1741 05/03/19 1610           Core Components/Risk Factors/Patient Goals Review   Personal Goals Review  Weight Management/Obesity;Lipids;Improve shortness of breath with ADL's;Hypertension  Weight Management/Obesity;Lipids;Improve shortness of breath with ADL's;Hypertension      Review  Patient with multiple CAD risk factors. He is eager to modify by participating in the CR exercise and education program.  Patient with multiple CAD risk factors willing to participate in CR exercise. He continues to tolerate exercise well and feels that he is increasing his motivation to exercise.      Expected Outcomes  Patient will continue to participate in CR with steady attendance to modify risk factors, improve nutrition, and learn other lifestyle modifications that can improve his health and decrease heart disease. His personal goals include improving his knowledge of heart healthy eating and decreasing his shortness of breath with exertion. He also wishes to improve his ability to participate in physical activities.  Patient will  continue to participate in CR with steady attendance to modify risk factors, improve nutrition, and learn other lifestyle modifications.  Core Components/Risk Factors/Patient Goals at Discharge (Final Review):  Goals and Risk Factor Review - 05/03/19 1610      Core Components/Risk Factors/Patient Goals Review   Personal Goals Review  Weight Management/Obesity;Lipids;Improve shortness of breath with ADL's;Hypertension    Review  Patient with multiple CAD risk factors willing to participate in CR exercise. He continues to tolerate exercise well and feels that he is increasing his motivation to exercise.    Expected Outcomes  Patient will continue to participate in CR with steady attendance to modify risk factors, improve nutrition, and learn other lifestyle modifications.       ITP Comments: ITP Comments    Row Name 04/02/19 0946 04/08/19 1736 05/03/19 1604       ITP Comments  Medical Director- Dr. Armanda Magic, MD  30 day ITP review: Patient completed his first CR exercise session today and tolerated well. Denied complaints. VSS  30 Day ITP Review.  Glennon continues to tolerate exercise well with increased workloads.  He feels that he is increasing his motivation to exercise.        Comments: See ITP Comments.

## 2019-05-10 ENCOUNTER — Other Ambulatory Visit: Payer: Self-pay

## 2019-05-10 ENCOUNTER — Encounter (HOSPITAL_COMMUNITY)
Admission: RE | Admit: 2019-05-10 | Discharge: 2019-05-10 | Disposition: A | Discharge status: Home / Self Care | Payer: Medicare Other | Source: Ambulatory Visit | Attending: Cardiology | Admitting: Cardiology

## 2019-05-10 DIAGNOSIS — Z955 Presence of coronary angioplasty implant and graft: Secondary | ICD-10-CM

## 2019-05-10 DIAGNOSIS — Z48812 Encounter for surgical aftercare following surgery on the circulatory system: Secondary | ICD-10-CM | POA: Diagnosis not present

## 2019-05-13 ENCOUNTER — Other Ambulatory Visit: Payer: Self-pay

## 2019-05-13 ENCOUNTER — Encounter (HOSPITAL_COMMUNITY)
Admission: RE | Admit: 2019-05-13 | Discharge: 2019-05-13 | Disposition: A | Discharge status: Home / Self Care | Payer: Medicare Other | Source: Ambulatory Visit | Attending: Cardiology | Admitting: Cardiology

## 2019-05-13 DIAGNOSIS — Z955 Presence of coronary angioplasty implant and graft: Secondary | ICD-10-CM

## 2019-05-13 DIAGNOSIS — Z48812 Encounter for surgical aftercare following surgery on the circulatory system: Secondary | ICD-10-CM | POA: Diagnosis not present

## 2019-05-15 ENCOUNTER — Other Ambulatory Visit: Payer: Self-pay

## 2019-05-15 ENCOUNTER — Encounter (HOSPITAL_COMMUNITY)
Admission: RE | Admit: 2019-05-15 | Discharge: 2019-05-15 | Disposition: A | Discharge status: Home / Self Care | Payer: Medicare Other | Source: Ambulatory Visit | Attending: Cardiology | Admitting: Cardiology

## 2019-05-15 DIAGNOSIS — Z48812 Encounter for surgical aftercare following surgery on the circulatory system: Secondary | ICD-10-CM | POA: Diagnosis not present

## 2019-05-15 DIAGNOSIS — Z955 Presence of coronary angioplasty implant and graft: Secondary | ICD-10-CM

## 2019-05-15 IMAGING — US US EXTREM LOW VENOUS*R*
1 series · 13 of 24 positions shown · non-contrast
Comparison: None.

CLINICAL DATA: Right leg swelling



[Series 1: us extrem low venous*right* · 0.08mm/px · 13 of 27 slices shown]
[im 1/27]
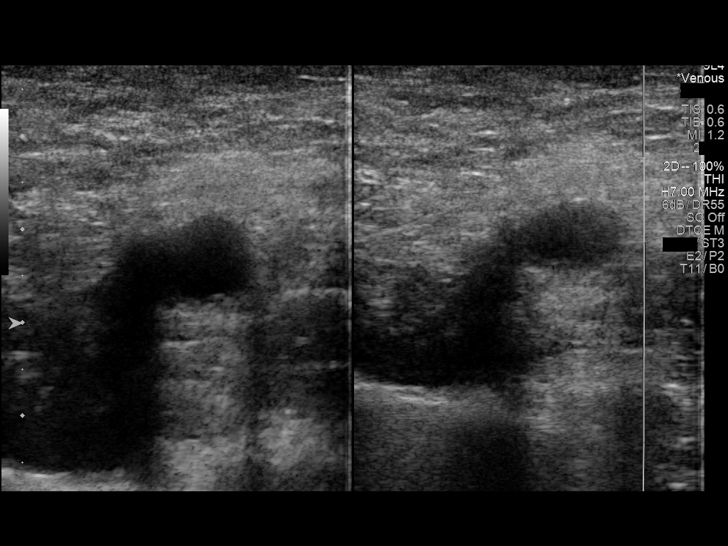
[im 3/27]
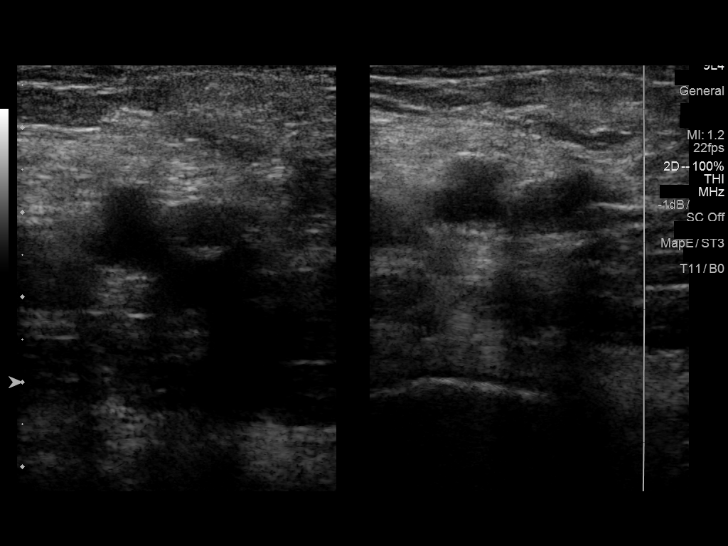
[im 5/27]
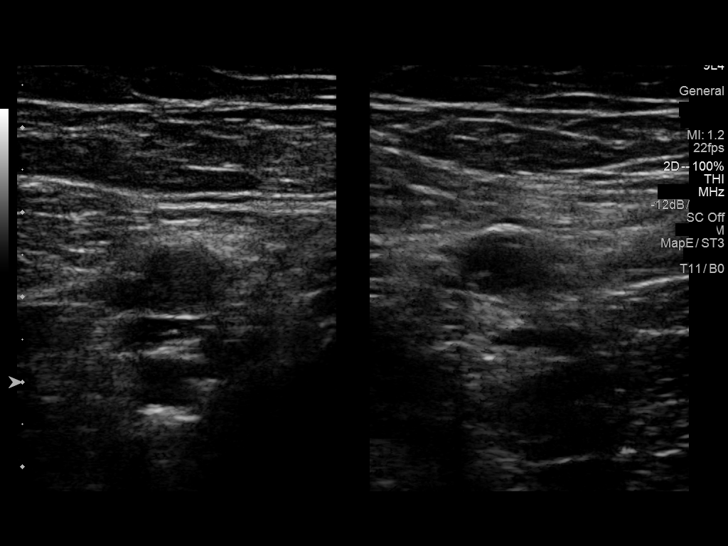
[im 7/27]
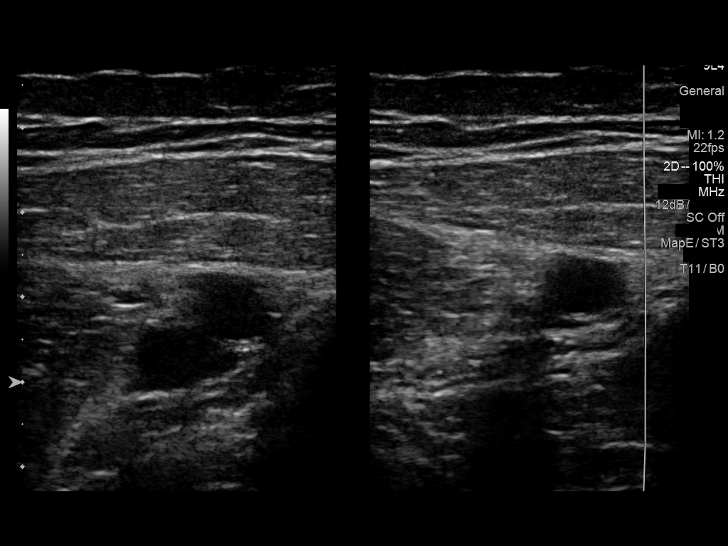
[im 10/27]
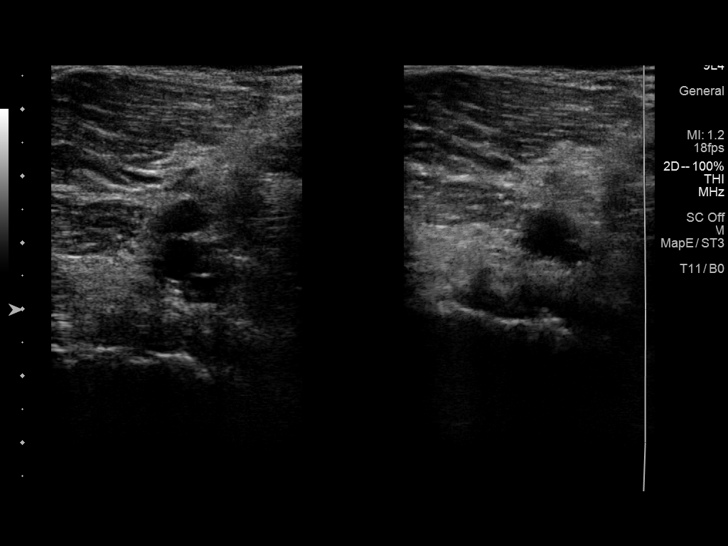
[im 12/27]
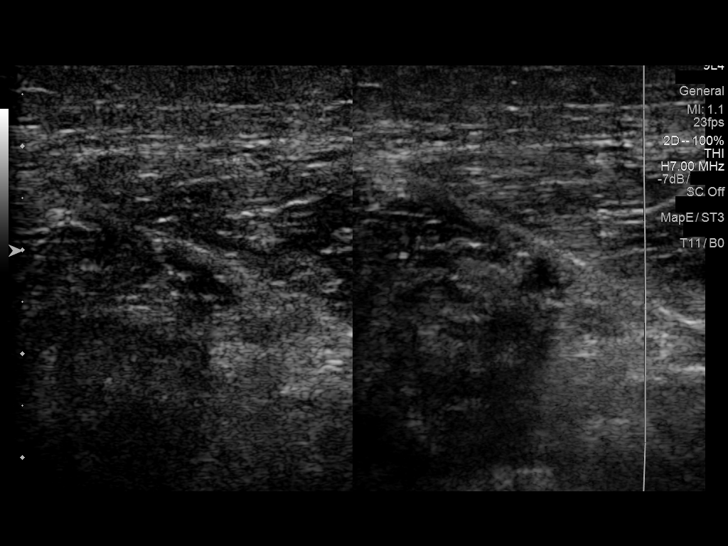
[im 14/27]
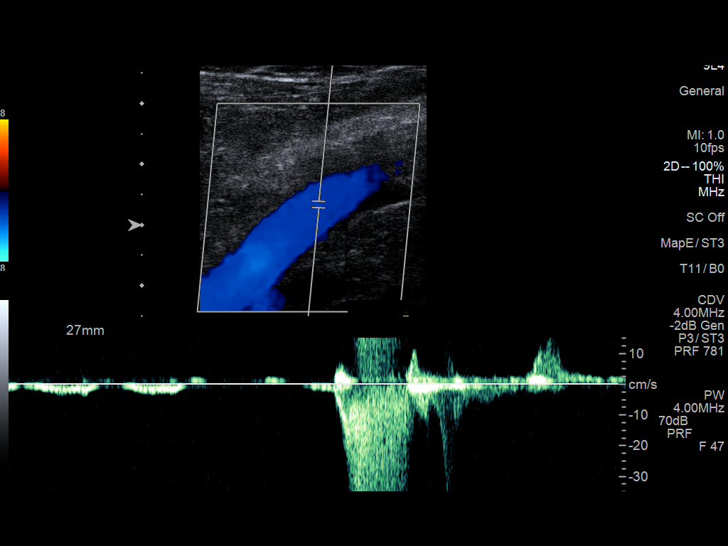
[im 15/27]
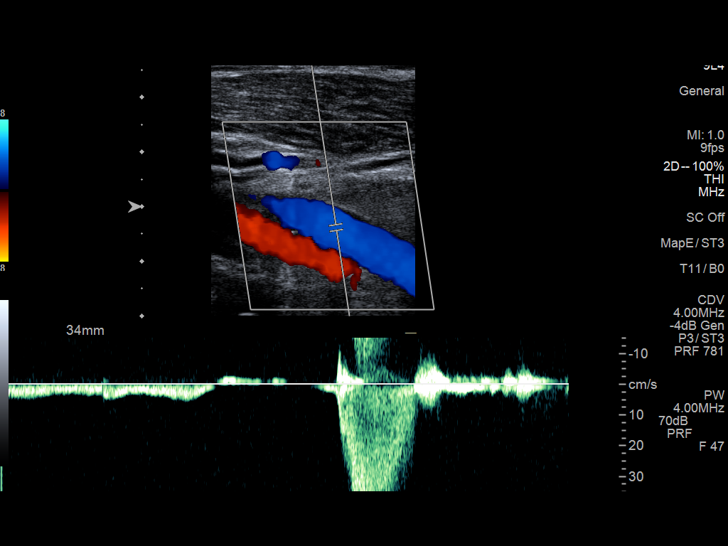
[im 17/27]
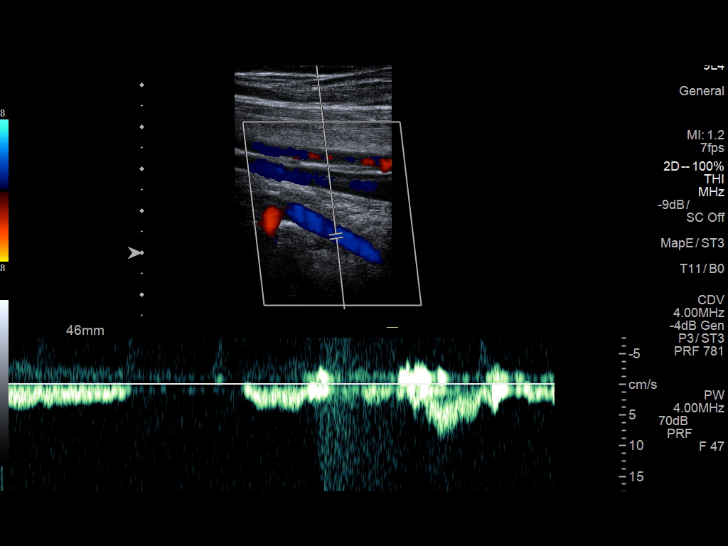
[im 20/27]
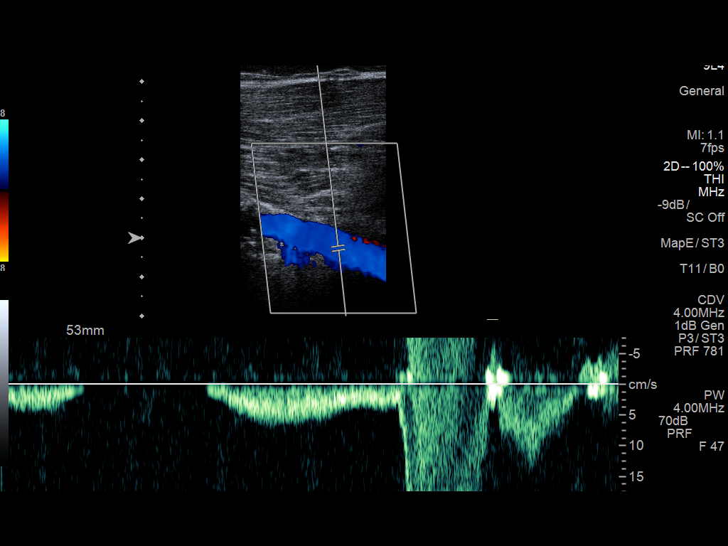
[im 22/27]
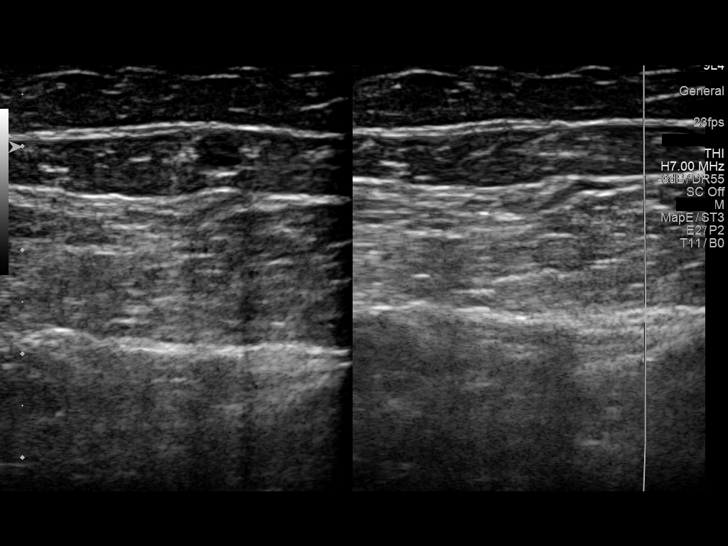
[im 24/27]
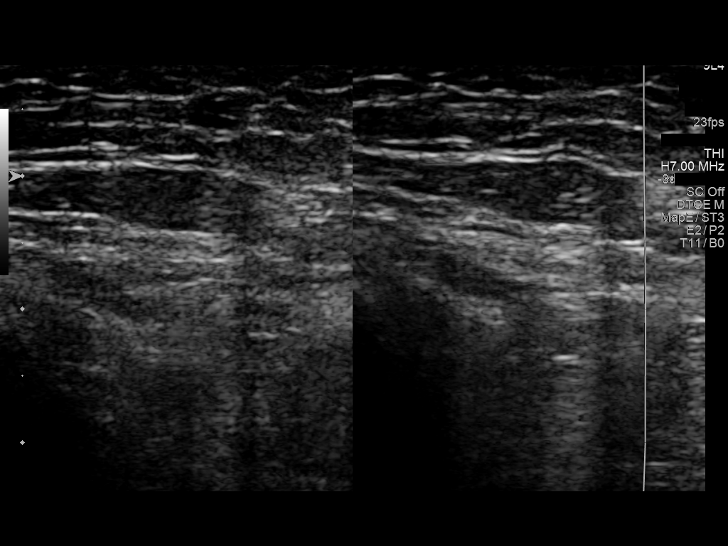
[im 27/27]
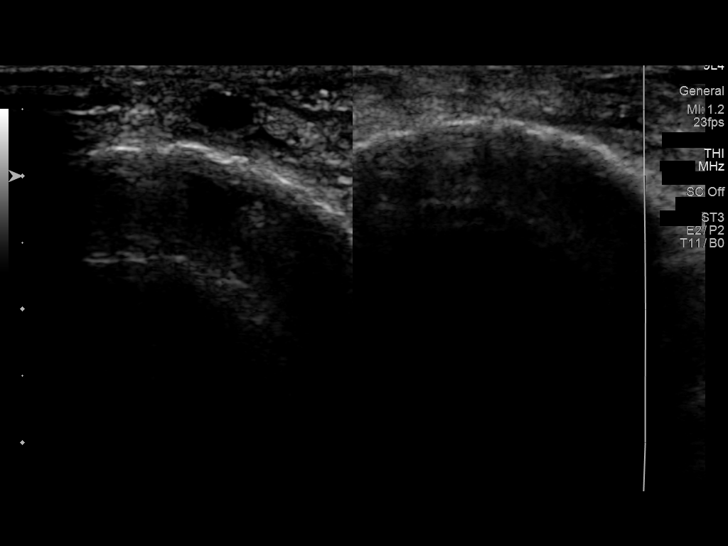

[13 of 24 positions shown; findings below may reference images not displayed]

FINDINGS: Contralateral Common Femoral Vein: Respiratory phasicity is normal
and symmetric with the symptomatic side. No evidence of thrombus.
Normal compressibility.

Common Femoral Vein: No evidence of thrombus. Normal
compressibility, respiratory phasicity and response to augmentation.

Saphenofemoral Junction: No evidence of thrombus. Normal
compressibility and flow on color Doppler imaging.

Profunda Femoral Vein: No evidence of thrombus. Normal
compressibility and flow on color Doppler imaging.

Femoral Vein: No evidence of thrombus. Normal compressibility,
respiratory phasicity and response to augmentation.

Popliteal Vein: No evidence of thrombus. Normal compressibility,
respiratory phasicity and response to augmentation.

Calf Veins: No evidence of thrombus. Normal compressibility and flow
on color Doppler imaging.

Superficial Great Saphenous Vein: No evidence of thrombus. Normal
compressibility and flow on color Doppler imaging.

Venous Reflux:  None.

Other Findings:  None.
IMPRESSION: No evidence of DVT within the right lower extremity.

## 2019-05-17 ENCOUNTER — Other Ambulatory Visit: Payer: Self-pay

## 2019-05-17 ENCOUNTER — Encounter (HOSPITAL_COMMUNITY)
Admission: RE | Admit: 2019-05-17 | Discharge: 2019-05-17 | Disposition: A | Discharge status: Home / Self Care | Payer: Medicare Other | Source: Ambulatory Visit | Attending: Cardiology | Admitting: Cardiology

## 2019-05-17 DIAGNOSIS — Z955 Presence of coronary angioplasty implant and graft: Secondary | ICD-10-CM

## 2019-05-17 DIAGNOSIS — Z48812 Encounter for surgical aftercare following surgery on the circulatory system: Secondary | ICD-10-CM | POA: Diagnosis not present

## 2019-05-20 ENCOUNTER — Encounter (HOSPITAL_COMMUNITY)
Admission: RE | Admit: 2019-05-20 | Discharge: 2019-05-20 | Disposition: A | Discharge status: Home / Self Care | Payer: Medicare Other | Source: Ambulatory Visit | Attending: Cardiology | Admitting: Cardiology

## 2019-05-20 ENCOUNTER — Other Ambulatory Visit: Payer: Self-pay

## 2019-05-20 DIAGNOSIS — Z48812 Encounter for surgical aftercare following surgery on the circulatory system: Secondary | ICD-10-CM | POA: Diagnosis not present

## 2019-05-20 DIAGNOSIS — Z955 Presence of coronary angioplasty implant and graft: Secondary | ICD-10-CM

## 2019-05-22 ENCOUNTER — Other Ambulatory Visit: Payer: Self-pay

## 2019-05-22 ENCOUNTER — Encounter (HOSPITAL_COMMUNITY)
Admission: RE | Admit: 2019-05-22 | Discharge: 2019-05-22 | Disposition: A | Discharge status: Home / Self Care | Payer: Medicare Other | Source: Ambulatory Visit | Attending: Cardiology | Admitting: Cardiology

## 2019-05-22 DIAGNOSIS — Z48812 Encounter for surgical aftercare following surgery on the circulatory system: Secondary | ICD-10-CM | POA: Diagnosis not present

## 2019-05-22 DIAGNOSIS — Z955 Presence of coronary angioplasty implant and graft: Secondary | ICD-10-CM

## 2019-05-24 ENCOUNTER — Encounter (HOSPITAL_COMMUNITY): Payer: Medicare Other

## 2019-05-27 ENCOUNTER — Other Ambulatory Visit: Payer: Self-pay

## 2019-05-27 ENCOUNTER — Encounter (HOSPITAL_COMMUNITY)
Admission: RE | Admit: 2019-05-27 | Discharge: 2019-05-27 | Disposition: A | Discharge status: Home / Self Care | Payer: Medicare Other | Source: Ambulatory Visit | Attending: Cardiology | Admitting: Cardiology

## 2019-05-27 DIAGNOSIS — Z955 Presence of coronary angioplasty implant and graft: Secondary | ICD-10-CM

## 2019-05-27 DIAGNOSIS — Z48812 Encounter for surgical aftercare following surgery on the circulatory system: Secondary | ICD-10-CM | POA: Diagnosis not present

## 2019-05-29 ENCOUNTER — Encounter (HOSPITAL_COMMUNITY)
Admission: RE | Admit: 2019-05-29 | Discharge: 2019-05-29 | Disposition: A | Discharge status: Home / Self Care | Payer: Medicare Other | Source: Ambulatory Visit | Attending: Cardiology | Admitting: Cardiology

## 2019-05-29 ENCOUNTER — Other Ambulatory Visit: Payer: Self-pay

## 2019-05-29 ENCOUNTER — Encounter (HOSPITAL_COMMUNITY): Payer: Self-pay

## 2019-05-29 VITALS — Ht 67.25 in | Wt 198.4 lb

## 2019-05-29 DIAGNOSIS — I10 Essential (primary) hypertension: Secondary | ICD-10-CM | POA: Insufficient documentation

## 2019-05-29 DIAGNOSIS — Z955 Presence of coronary angioplasty implant and graft: Secondary | ICD-10-CM | POA: Diagnosis not present

## 2019-05-29 DIAGNOSIS — Z7982 Long term (current) use of aspirin: Secondary | ICD-10-CM | POA: Diagnosis not present

## 2019-05-29 DIAGNOSIS — E785 Hyperlipidemia, unspecified: Secondary | ICD-10-CM | POA: Diagnosis not present

## 2019-05-29 DIAGNOSIS — I2510 Atherosclerotic heart disease of native coronary artery without angina pectoris: Secondary | ICD-10-CM | POA: Insufficient documentation

## 2019-05-29 DIAGNOSIS — Z48812 Encounter for surgical aftercare following surgery on the circulatory system: Secondary | ICD-10-CM | POA: Diagnosis present

## 2019-05-29 DIAGNOSIS — Z79899 Other long term (current) drug therapy: Secondary | ICD-10-CM | POA: Insufficient documentation

## 2019-05-29 DIAGNOSIS — Z7902 Long term (current) use of antithrombotics/antiplatelets: Secondary | ICD-10-CM | POA: Diagnosis not present

## 2019-05-31 ENCOUNTER — Encounter (HOSPITAL_COMMUNITY)
Admission: RE | Admit: 2019-05-31 | Discharge: 2019-05-31 | Disposition: A | Discharge status: Home / Self Care | Payer: Medicare Other | Source: Ambulatory Visit | Attending: Cardiology | Admitting: Cardiology

## 2019-05-31 ENCOUNTER — Other Ambulatory Visit: Payer: Self-pay

## 2019-05-31 DIAGNOSIS — Z48812 Encounter for surgical aftercare following surgery on the circulatory system: Secondary | ICD-10-CM | POA: Diagnosis not present

## 2019-05-31 DIAGNOSIS — Z955 Presence of coronary angioplasty implant and graft: Secondary | ICD-10-CM

## 2019-06-06 NOTE — Progress Notes (Signed)
Discharge Progress Report  Patient Details  Name: Bradley Parker MRN: [id number] Date of Birth: 1950-07-17 Referring Provider:     CARDIAC REHAB PHASE II ORIENTATION from 04/02/2019 in [place]  Referring Provider  Dr Adrian Prows MD       Number of Visits: 21  Reason for Discharge:  Patient reached a stable level of exercise. Patient independent in their exercise. Patient has met program and personal goals.  Smoking History:  Social History   Tobacco Use  Smoking Status Never Smoker  Smokeless Tobacco Never Used    Diagnosis:  03/12/19 DES LAD  ADL [facility]:   Initial Exercise Prescription: Initial Exercise Prescription - 04/02/19 1100      Date of Initial Exercise RX and Referring Provider   Date  04/02/19    Referring Provider  Dr Adrian Prows MD    Expected Discharge Date  05/31/19      Treadmill   MPH  2.8    Grade  0    Minutes  15    METs  3.14      NuStep   Level  3    SPM  85    Minutes  15    METs  3      Prescription Details   Frequency (times per week)  3    Duration  Progress to 30 minutes of continuous aerobic without signs/symptoms of physical distress      Intensity   THRR 40-80% of Max Heartrate  62-124    Ratings of Perceived Exertion  11-13    Perceived Dyspnea  0-4      Progression   Progression  Continue to progress workloads to maintain intensity without signs/symptoms of physical distress.      Resistance Training   Training Prescription  Yes    Weight  4lbs    Reps  10-15       Discharge Exercise Prescription (Final Exercise Prescription Changes): Exercise Prescription Changes - 05/31/19 0914      Response to Exercise   Blood Pressure (Admit)  98/60    Blood Pressure (Exercise)  120/70    Blood Pressure (Exit)  100/58    Heart Rate (Admit)  68 bpm    Heart Rate (Exercise)  117 bpm    Heart Rate (Exit)  75 bpm    Rating of Perceived Exertion (Exercise)  12    Perceived Dyspnea (Exercise)  0     Symptoms  none    Duration  Continue with 30 min of aerobic exercise without signs/symptoms of physical distress.    Intensity  THRR unchanged      Progression   Progression  Continue to progress workloads to maintain intensity without signs/symptoms of physical distress.    Average METs  4.8      Resistance Training   Training Prescription  Yes    Weight  6lbs    Reps  10-15    Time  10 Minutes      Interval Training   Interval Training  No      Treadmill   MPH  3.3    Grade  1    Minutes  15    METs  3.98      NuStep   Level  5    SPM  100    Minutes  15    METs  5.2      Home Exercise Plan   Plans to continue exercise at  Home (comment)  Walking   Frequency  Add 3 additional days to program exercise sessions.    Initial Home Exercises Provided  04/19/19       Functional Capacity: 6 Minute Walk    Row Name 04/02/19 1020 05/29/19 0927       6 Minute Walk   Phase  Initial  Discharge    Distance  1450 feet  1880 feet    Distance % Change  --  29.66 %    Distance Feet Change  --  430 ft    Walk Time  6 minutes  6 minutes    # of Rest Breaks  0  0    MPH  2.75  3.6    METS  3.12  4.2    RPE  9  11    Perceived Dyspnea   0  0    VO2 Peak  10.93  14.6    Symptoms  No  No    Resting HR  81 bpm  77 bpm    Resting BP  108/72  120/76    Resting Oxygen Saturation   99 %  --    Exercise Oxygen Saturation  during 6 min walk  99 %  --    Max Ex. HR  100 bpm  111 bpm    Max Ex. BP  114/78  138/82    2 Minute Post BP  98/70  102/70       Psychological, QOL, Others - Outcomes: PHQ 2/9: Depression screen PHQ 2/9 05/29/2019  Decreased Interest 0  Down, Depressed, Hopeless 0  PHQ - 2 Score 0    Quality of Life: Quality of Life - 05/29/19 0926      Quality of Life Scores   Health/Function Pre  24.67 %    Health/Function Post  26.4 %    Health/Function % Change  7.01 %    Socioeconomic Pre  27.86 %    Socioeconomic Post  26.75 %    Socioeconomic %  Change   -3.98 %    Psych/Spiritual Pre  25.5 %    Psych/Spiritual Post  24 %    Psych/Spiritual % Change  -5.88 %    Family Pre  30 %    Family Post  26.4 %    Family % Change  -12 %    GLOBAL Pre  26.28 %    GLOBAL Post  26.02 %    GLOBAL % Change  -0.99 %       Personal Goals: Goals established at orientation with interventions provided to work toward goal. Personal Goals and Risk Factors at Admission - 04/02/19 1121      Core Components/Risk Factors/Patient Goals on Admission    Weight Management  Obesity;Yes    Intervention  Weight Management/Obesity: Establish reasonable short term and long term weight goals.;Weight Management: Develop a combined nutrition and exercise program designed to reach desired caloric intake, while maintaining appropriate intake of nutrient and fiber, sodium and fats, and appropriate energy expenditure required for the weight goal.;Weight Management: Provide education and appropriate resources to help participant work on and attain dietary goals.;Obesity: Provide education and appropriate resources to help participant work on and attain dietary goals.    Admit Weight  197 lb 15.6 oz (89.8 kg)    Improve shortness of breath with ADL's  Yes    Intervention  Provide education, individualized exercise plan and daily activity instruction to help decrease symptoms of SOB with activities of daily living.  Expected Outcomes  Short Term: Improve cardiorespiratory fitness to achieve a reduction of symptoms when performing ADLs;Long Term: Be able to perform more ADLs without symptoms or delay the onset of symptoms    Hypertension  Yes    Intervention  Provide education on lifestyle modifcations including regular physical activity/exercise, weight management, moderate sodium restriction and increased consumption of fresh fruit, vegetables, and low fat dairy, alcohol moderation, and smoking cessation.;Monitor prescription use compliance.    Expected Outcomes  Short  Term: Continued assessment and intervention until BP is < 140/56m HG in hypertensive participants. < 130/817mHG in hypertensive participants with diabetes, heart failure or chronic kidney disease.;Long Term: Maintenance of blood pressure at goal levels.    Lipids  Yes    Intervention  Provide education and support for participant on nutrition & aerobic/resistive exercise along with prescribed medications to achieve LDL <7014mHDL >21m61m  Expected Outcomes  Short Term: Participant states understanding of desired cholesterol values and is compliant with medications prescribed. Participant is following exercise prescription and nutrition guidelines.;Long Term: Cholesterol controlled with medications as prescribed, with individualized exercise RX and with personalized nutrition plan. Value goals: LDL < 70mg38mL > 40 mg.        Personal Goals Discharge: Goals and Risk Factor Review    Row Name 04/08/19 1741 05/03/19 1610 06/06/19 1614         Core Components/Risk Factors/Patient Goals Review   Personal Goals Review  Weight Management/Obesity;Lipids;Improve shortness of breath with ADL's;Hypertension  Weight Management/Obesity;Lipids;Improve shortness of breath with ADL's;Hypertension  Weight Management/Obesity;Lipids;Improve shortness of breath with ADL's;Hypertension     Review  Patient with multiple CAD risk factors. He is eager to modify by participating in the CR exercise and education program.  Patient with multiple CAD risk factors willing to participate in CR exercise. He continues to tolerate exercise well and feels that he is increasing his motivation to exercise.  Ulysess graduated CR with 21 completed sessions.  He felt that increased his consistency with an exercise routine.     Expected Outcomes  Patient will continue to participate in CR with steady attendance to modify risk factors, improve nutrition, and learn other lifestyle modifications that can improve his health and decrease  heart disease. His personal goals include improving his knowledge of heart healthy eating and decreasing his shortness of breath with exertion. He also wishes to improve his ability to participate in physical activities.  Patient will continue to participate in CR with steady attendance to modify risk factors, improve nutrition, and learn other lifestyle modifications.  Patient will continue to modify risk factors, improve nutrition, and learn other lifestyle modifications. Malcolm plans to go to [employer] a routine similar to CR for his exercise.        Exercise Goals and Review: Exercise Goals    Row Name 04/02/19 1006             Exercise Goals   Increase Physical Activity  Yes       Intervention  Provide advice, education, support and counseling about physical activity/exercise needs.;Develop an individualized exercise prescription for aerobic and resistive training based on initial evaluation findings, risk stratification, comorbidities and participant's personal goals.       Expected Outcomes  Short Term: Attend rehab on a regular basis to increase amount of physical activity.;Long Term: Exercising regularly at least 3-5 days a week.;Long Term: Add in home exercise to make exercise part of routine and to increase amount of physical  activity.       Increase Strength and Stamina  Yes       Intervention  Provide advice, education, support and counseling about physical activity/exercise needs.;Develop an individualized exercise prescription for aerobic and resistive training based on initial evaluation findings, risk stratification, comorbidities and participant's personal goals.       Expected Outcomes  Short Term: Increase workloads from initial exercise prescription for resistance, speed, and METs.;Short Term: Perform resistance training exercises routinely during rehab and add in resistance training at home;Long Term: Improve cardiorespiratory fitness, muscular endurance and  strength as measured by increased METs and functional capacity (6MWT)       Able to understand and use rate of perceived exertion (RPE) scale  Yes       Intervention  Provide education and explanation on how to use RPE scale       Expected Outcomes  Short Term: Able to use RPE daily in rehab to express subjective intensity level;Long Term:  Able to use RPE to guide intensity level when exercising independently       Knowledge and understanding of Target Heart Rate Range (THRR)  Yes       Intervention  Provide education and explanation of THRR including how the numbers were predicted and where they are located for reference       Expected Outcomes  Short Term: Able to state/look up THRR;Long Term: Able to use THRR to govern intensity when exercising independently;Short Term: Able to use daily as guideline for intensity in rehab       Able to check pulse independently  Yes       Intervention  Provide education and demonstration on how to check pulse in carotid and radial arteries.;Review the importance of being able to check your own pulse for safety during independent exercise       Expected Outcomes  Short Term: Able to explain why pulse checking is important during independent exercise;Long Term: Able to check pulse independently and accurately       Understanding of Exercise Prescription  Yes       Intervention  Provide education, explanation, and written materials on patient's individual exercise prescription       Expected Outcomes  Short Term: Able to explain program exercise prescription;Long Term: Able to explain home exercise prescription to exercise independently          Exercise Goals Re-Evaluation: Exercise Goals Re-Evaluation    Row Name 04/08/19 0815 04/19/19 0753 06/03/19 0926         Exercise Goal Re-Evaluation   Exercise Goals Review  Able to understand and use rate of perceived exertion (RPE) scale;Increase Physical Activity  Able to understand and use rate of perceived  exertion (RPE) scale;Increase Physical Activity;Understanding of Exercise Prescription  Able to understand and use rate of perceived exertion (RPE) scale;Increase Physical Activity;Understanding of Exercise Prescription;Able to check pulse independently;Knowledge and understanding of Target Heart Rate Range (THRR);Increase Strength and Stamina     Comments  Patient able to understand and use RPE scale appropriately.  Reviewed home exercise guidelines with patient including THRR, RPE scale, and endpoints for exercise. Reviewed pulse counting, pt states he knows how to find his pulse, handout given. Pt states he's walking 15 minutes, 3 days/week in addition to his exercise at cardiac rehab.Discussed increasing duration to achieve 30 minutes on his 3 days with a goal of achieving 150 minutes of exercise per week.  Pt completed 21 sessions. Pt increased functional capacity by 29.66%. Increased his  post 6MWT distance by 431f. Pt also increased his single leg balance test from 21secs to 35 secs. Pt made great progress while in rehab.     Expected Outcomes  Increase workloads as tolerated to help increase cardiorespiratory fitness.  Patient will  increase walking duration at home to accumulate 150 minutes of aerobic exercise activity per week.  Pt will dance and walk 3-5 days a week for 30-45 minutes. Pt's long term plan is to join local gym once it is safer.        Nutrition & Weight - Outcomes: Pre Biometrics - 04/02/19 1053      Pre Biometrics   Height  5' 7.25" (1.708 m)    Weight  89.8 kg    Waist Circumference  39.5 inches    Hip Circumference  45 inches    Waist to Hip Ratio  0.88 %    BMI (Calculated)  30.78    Triceps Skinfold  11 mm    % Body Fat  27.2 %    Grip Strength  43.5 kg    Flexibility  20 in    Single Leg Stand  21.5 seconds      Post Biometrics - 05/29/19 0929       Post  Biometrics   Height  5' 7.25" (1.708 m)    Weight  90 kg    Waist Circumference  40 inches    Hip  Circumference  44 inches    Waist to Hip Ratio  0.91 %    BMI (Calculated)  30.85    Triceps Skinfold  11 mm    % Body Fat  27.4 %    Grip Strength  35 kg    Flexibility  22.5 in    Single Leg Stand  35 seconds       Nutrition: Nutrition Therapy & Goals - 05/07/19 1121      Nutrition Therapy   Diet  Mediterranean    Drug/Food Interactions  Statins/Certain Fruits      Personal Nutrition Goals   Nutrition Goal  Pt to build a healthy plate including vegetables, fruits, whole grains, and low-fat dairy products in a heart healthy meal plan.      Intervention Plan   Intervention  Prescribe, educate and counsel regarding individualized specific dietary modifications aiming towards targeted core components such as weight, hypertension, lipid management, diabetes, heart failure and other comorbidities.;Nutrition handout(s) given to patient.    Expected Outcomes  Short Term Goal: Understand basic principles of dietary content, such as calories, fat, sodium, cholesterol and nutrients.;Long Term Goal: Adherence to prescribed nutrition plan.       Nutrition Discharge:   Education Questionnaire Score: Knowledge Questionnaire Score - 05/29/19 0926      Knowledge Questionnaire Score   Pre Score  21/24    Post Score  21/24       Goals reviewed with patient; copy given to patient.

## 2019-06-10 ENCOUNTER — Ambulatory Visit: Payer: Medicare Other | Admitting: Cardiology

## 2019-06-10 NOTE — Progress Notes (Deleted)
Primary Physician:  [name], MD   Patient ID: Bradley Parker, male    DOB: 10-19-1950, 68 y.o.   MRN: [id number]  Subjective:    No chief complaint on file.   HPI: Bradley Parker  is a 68 y.o. male  with hypertension, preglaucoma, Vitamin D deficiency, although he had atypical chest pain, in view of high risk stress test, underwent coronary angiogram on 03/12/2019 revealing high grade lesion in LAD in which he underwent PCI.   Patient is here for 3 month office visit for CAD.  He has recently completed cardiac rehab on 12/1.  He is doing well without any symptoms of angina.  Tolerating medications well including dual antiplatelet therapy.  No former tobacco use. Rare alcohol use. No illicit drug use.   He is retired and lives with his wife. Has one daughter that lives in [place].   Past Medical History:  Diagnosis Date  . Coronary artery disease   . HTN (hypertension) 01/04/2019  . Hyperlipidemia     Past Surgical History:  Procedure Laterality Date  . CATARACT EXTRACTION Left 01/29/2019  . CORONARY STENT INTERVENTION N/A 03/12/2019   Procedure: CORONARY STENT INTERVENTION;  Surgeon: Adrian Prows, MD;  Location: [place] [facility];  Service: Cardiovascular;  Laterality: N/A;  . HERNIA REPAIR    . LEFT HEART CATH AND CORONARY ANGIOGRAPHY N/A 03/12/2019   Procedure: LEFT HEART CATH AND CORONARY ANGIOGRAPHY;  Surgeon: Adrian Prows, MD;  Location: [place] [facility];  Service: Cardiovascular;  Laterality: N/A;    Social History   Socioeconomic History  . Marital status: Married    Spouse name: Not on file  . Number of children: 1  . Years of education: Not on file  . Highest education level: Master's degree (e.g., MA, MS, MEng, MEd, MSW, MBA)  Occupational History  . Not on file  Tobacco Use  . Smoking status: Never Smoker  . Smokeless tobacco: Never Used  Substance and Sexual Activity  . Alcohol use: Not Currently  . Drug use: Never  . Sexual activity: Not on file    Other Topics Concern  . Not on file  Social History Narrative  . Not on file   Social Determinants of Health   Financial Resource Strain: Low Risk   . Difficulty of Paying Living Expenses: Not hard at all  Food Insecurity: No Food Insecurity  . Worried About Charity fundraiser in the Last Year: Never true  . Ran Out of Food in the Last Year: Never true  Transportation Needs: No Transportation Needs  . Lack of Transportation (Medical): No  . Lack of Transportation (Non-Medical): No  Physical Activity: Inactive  . Days of Exercise per Week: 0 days  . Minutes of Exercise per Session: 0 min  Stress: No Stress Concern Present  . Feeling of Stress : Not at all  Social Connections:   . Frequency of Communication with Friends and Family: Not on file  . Frequency of Social Gatherings with Friends and Family: Not on file  . Attends Religious Services: Not on file  . Active Member of Clubs or Organizations: Not on file  . Attends Archivist Meetings: Not on file  . Marital Status: Not on file  Intimate Partner Violence:   . Fear of Current or Ex-Partner: Not on file  . Emotionally Abused: Not on file  . Physically Abused: Not on file  . Sexually Abused: Not on file    Review of Systems  Constitution: Negative for decreased appetite, malaise/fatigue, weight gain and weight loss.  Eyes: Negative for visual disturbance.  Cardiovascular: Negative for chest pain, claudication, dyspnea on exertion, leg swelling, orthopnea, palpitations and syncope.  Respiratory: Negative for hemoptysis and wheezing.   Endocrine: Negative for cold intolerance and heat intolerance.  Hematologic/Lymphatic: Does not bruise/bleed easily.  Skin: Negative for nail changes.  Musculoskeletal: Negative for muscle weakness and myalgias.  Gastrointestinal: Negative for abdominal pain, change in bowel habit, nausea and vomiting.  Neurological: Negative for difficulty with concentration, dizziness, focal  weakness and headaches.  Psychiatric/Behavioral: Negative for altered mental status and suicidal ideas.  All other systems reviewed and are negative.     Objective:  There were no vitals taken for this visit. There is no height or weight on file to calculate BMI.    Physical Exam  Constitutional: He is oriented to person, place, and time. Vital signs are normal. He appears well-developed and well-nourished.  HENT:  Head: Normocephalic and atraumatic.  Cardiovascular: Normal rate, regular rhythm, normal heart sounds and intact distal pulses.  Pulses:      Dorsalis pedis pulses are 2+ on the right side and 2+ on the left side.       Posterior tibial pulses are 2+ on the right side and 2+ on the left side.  Ejection click at ULSB  Pulmonary/Chest: Effort normal and breath sounds normal. No accessory muscle usage. No respiratory distress.  Abdominal: Soft. Bowel sounds are normal.  Musculoskeletal:        General: Normal range of motion.     Cervical back: Normal range of motion.  Neurological: He is alert and oriented to person, place, and time.  Skin: Skin is warm and dry.  Vitals reviewed.  Radiology: No results found.  Laboratory examination:   12/25/2018: TSH 1.42.  Creatinine 1.3 EGFR 54/66, potassium 4.8, CMP otherwise normal.  Vitamin D 30.  Cholesterol 189, triglycerides 70, HDL 44, LDL 131.  CMP Latest Ref Rng & Units 03/01/2019  Glucose 65 - 99 mg/dL 117(H)  BUN 8 - 27 mg/dL 19  Creatinine 0.76 - 1.27 mg/dL 1.41(H)  Sodium 134 - 144 mmol/L 142  Potassium 3.5 - 5.2 mmol/L 4.5  Chloride 96 - 106 mmol/L 106  CO2 20 - 29 mmol/L 23  Calcium 8.6 - 10.2 mg/dL 9.6   CBC Latest Ref Rng & Units 03/01/2019  WBC 3.4 - 10.8 x10E3/uL 5.5  Hemoglobin 13.0 - 17.7 g/dL 13.5  Hematocrit 37.5 - 51.0 % 42.2  Platelets 150 - 450 x10E3/uL 131(L)   Lipid Panel  No results found for: CHOL, TRIG, HDL, CHOLHDL, VLDL, LDLCALC, LDLDIRECT HEMOGLOBIN A1C No results found for: HGBA1C,  MPG TSH No results for input(s): TSH in the last 8760 hours.  PRN Meds:. There are no discontinued medications. No outpatient medications have been marked as taking for the 06/10/19 encounter (Appointment) with Miquel Dunn, NP.    Cardiac Studies:   Coronary Angiography 03/12/19: Normal LVEF. Normal LVEDP. Ost LM lesion is 15% stenosed.  Noraml RCA and RI and RCA. Mid LAD lesion is 95% stenosedS/P BALLOON WOLVERINE 3.50X10 followed by [place] 3.5X18. Maximum pressure: 12 atm. 95% to 0%.   Lexiscan Sestamibi Stress Test 01/21/2019: Resting EKG months normal sinus rhythm, poor R-wave progression, nonspecific diffuse T abnormality.  Stress EKG non-diagnostic due to pharmacologic stress testing, There was pseudo-normalization of T inversion. No symptoms were experienced. Perfusion imaging study demonstrates a moderate-sized moderate ischemia involving the anteroseptal and apical anterolateral  ischemia extending from the mid ventricle.  Left ventricle systolic function Related by QGS was mildly depressed at 44% with anterior hypokinesis.  High risk study.  Echo- 01/23/2019 1. Normal LV systolic function with EF 58%. Left ventricle cavity is normal in size. Moderate concentric hypertrophy of the left ventricle. Normal global wall motion. Doppler evidence of grade I (impaired) diastolic dysfunction. Calculated EF 58%. 2. Mild (Grade I) mitral regurgitation. 3. Mild tricuspid regurgitation. No evidence of pulmonary hypertension.  Assessment:   No diagnosis found.  EKG 03/20/2019: Sinus bradycardia at 59 bpm, normal axis, T wave flattening in anterolateral leads, cannot exclude ischemia.  No changes compared to previous EKG.  Recommendations:   Patient is doing well without any symptoms of angina.  Tolerating medications well.  Blood pressure remains well controlled.  He is aware to continue with dual antiplatelet for at least 1 year.  Encouraged him to remain with  regular exercise.  He has not had recent lipid panel, would recommend repeating this for surveillance of LDL as his LDL was previously markedly elevated.  I will see him back in 6 months, but encouraged him to contact me sooner if needed.  Miquel Dunn, MSN, APRN, FNP-C [facility]. [place] Office: [phone] Fax: [phone]

## 2019-06-11 ENCOUNTER — Ambulatory Visit (INDEPENDENT_AMBULATORY_CARE_PROVIDER_SITE_OTHER): Payer: Medicare Other | Admitting: Cardiology

## 2019-06-11 ENCOUNTER — Other Ambulatory Visit: Payer: Self-pay

## 2019-06-11 ENCOUNTER — Encounter: Payer: Self-pay | Admitting: Cardiology

## 2019-06-11 VITALS — BP 112/82 | HR 55 | Temp 97.2°F | Ht 68.0 in | Wt 199.0 lb

## 2019-06-11 DIAGNOSIS — I2510 Atherosclerotic heart disease of native coronary artery without angina pectoris: Secondary | ICD-10-CM

## 2019-06-11 DIAGNOSIS — E785 Hyperlipidemia, unspecified: Secondary | ICD-10-CM | POA: Diagnosis not present

## 2019-06-11 DIAGNOSIS — I10 Essential (primary) hypertension: Secondary | ICD-10-CM

## 2019-06-11 NOTE — Progress Notes (Signed)
Primary Physician:  [name], MD   Patient ID: Bradley Parker, male    DOB: 1950/12/09, 68 y.o.   MRN: [id number]  Subjective:    Chief Complaint  Patient presents with  . Coronary Artery Disease  . Follow-up    6 month    HPI: Bradley Parker  is a 68 y.o. male  with hypertension, preglaucoma, Vitamin D deficiency, although he had atypical chest pain, in view of high risk stress test, underwent coronary angiogram on 03/12/2019 revealing high grade lesion in LAD in which he underwent PCI.   Patient is here for 3 month office visit for CAD.  He has recently completed cardiac rehab on 12/1.  He is doing well without any symptoms of angina.  Tolerating medications well including dual antiplatelet therapy.  No former tobacco use. Rare alcohol use. No illicit drug use.   He is retired and lives with his wife. Has one daughter that lives in [place].   Past Medical History:  Diagnosis Date  . Coronary artery disease   . HTN (hypertension) 01/04/2019  . Hyperlipidemia     Past Surgical History:  Procedure Laterality Date  . CATARACT EXTRACTION Left 01/29/2019  . CORONARY STENT INTERVENTION N/A 03/12/2019   Procedure: CORONARY STENT INTERVENTION;  Surgeon: Adrian Prows, MD;  Location: [place] [facility];  Service: Cardiovascular;  Laterality: N/A;  . HERNIA REPAIR    . LEFT HEART CATH AND CORONARY ANGIOGRAPHY N/A 03/12/2019   Procedure: LEFT HEART CATH AND CORONARY ANGIOGRAPHY;  Surgeon: Adrian Prows, MD;  Location: [place] [facility];  Service: Cardiovascular;  Laterality: N/A;    Social History   Socioeconomic History  . Marital status: Married    Spouse name: Not on file  . Number of children: 1  . Years of education: Not on file  . Highest education level: Master's degree (e.g., MA, MS, MEng, MEd, MSW, MBA)  Occupational History  . Not on file  Tobacco Use  . Smoking status: Never Smoker  . Smokeless tobacco: Never Used  Substance and Sexual Activity  . Alcohol use:  Not Currently  . Drug use: Never  . Sexual activity: Not on file  Other Topics Concern  . Not on file  Social History Narrative  . Not on file   Social Determinants of Health   Financial Resource Strain: Low Risk   . Difficulty of Paying Living Expenses: Not hard at all  Food Insecurity: No Food Insecurity  . Worried About Charity fundraiser in the Last Year: Never true  . Ran Out of Food in the Last Year: Never true  Transportation Needs: No Transportation Needs  . Lack of Transportation (Medical): No  . Lack of Transportation (Non-Medical): No  Physical Activity: Inactive  . Days of Exercise per Week: 0 days  . Minutes of Exercise per Session: 0 min  Stress: No Stress Concern Present  . Feeling of Stress : Not at all  Social Connections:   . Frequency of Communication with Friends and Family: Not on file  . Frequency of Social Gatherings with Friends and Family: Not on file  . Attends Religious Services: Not on file  . Active Member of Clubs or Organizations: Not on file  . Attends Archivist Meetings: Not on file  . Marital Status: Not on file  Intimate Partner Violence:   . Fear of Current or Ex-Partner: Not on file  . Emotionally Abused: Not on file  . Physically Abused: Not on file  .  Sexually Abused: Not on file    Review of Systems  Constitution: Negative for decreased appetite, malaise/fatigue, weight gain and weight loss.  Eyes: Negative for visual disturbance.  Cardiovascular: Negative for chest pain, claudication, dyspnea on exertion, leg swelling, orthopnea, palpitations and syncope.  Respiratory: Negative for hemoptysis and wheezing.   Endocrine: Negative for cold intolerance and heat intolerance.  Hematologic/Lymphatic: Does not bruise/bleed easily.  Skin: Negative for nail changes.  Musculoskeletal: Negative for muscle weakness and myalgias.  Gastrointestinal: Negative for abdominal pain, change in bowel habit, nausea and vomiting.    Neurological: Negative for difficulty with concentration, dizziness, focal weakness and headaches.  Psychiatric/Behavioral: Negative for altered mental status and suicidal ideas.  All other systems reviewed and are negative.     Objective:  Blood pressure 112/82, pulse (!) 55, temperature (!) 97.2 F (36.2 C), height 5' 8" (1.727 m), weight 199 lb (90.3 kg), SpO2 99 %. Body mass index is 30.26 kg/m.    Physical Exam  Constitutional: He is oriented to person, place, and time. Vital signs are normal. He appears well-developed and well-nourished.  HENT:  Head: Normocephalic and atraumatic.  Cardiovascular: Normal rate, regular rhythm, normal heart sounds and intact distal pulses.  Pulses:      Dorsalis pedis pulses are 2+ on the right side and 2+ on the left side.       Posterior tibial pulses are 2+ on the right side and 2+ on the left side.  Ejection click at ULSB  Pulmonary/Chest: Effort normal and breath sounds normal. No accessory muscle usage. No respiratory distress.  Abdominal: Soft. Bowel sounds are normal.  Musculoskeletal:        General: Normal range of motion.     Cervical back: Normal range of motion.  Neurological: He is alert and oriented to person, place, and time.  Skin: Skin is warm and dry.  Vitals reviewed.  Radiology: No results found.  Laboratory examination:   12/25/2018: TSH 1.42.  Creatinine 1.3 EGFR 54/66, potassium 4.8, CMP otherwise normal.  Vitamin D 30.  Cholesterol 189, triglycerides 70, HDL 44, LDL 131.  CMP Latest Ref Rng & Units 03/01/2019  Glucose 65 - 99 mg/dL 117(H)  BUN 8 - 27 mg/dL 19  Creatinine 0.76 - 1.27 mg/dL 1.41(H)  Sodium 134 - 144 mmol/L 142  Potassium 3.5 - 5.2 mmol/L 4.5  Chloride 96 - 106 mmol/L 106  CO2 20 - 29 mmol/L 23  Calcium 8.6 - 10.2 mg/dL 9.6   CBC Latest Ref Rng & Units 03/01/2019  WBC 3.4 - 10.8 x10E3/uL 5.5  Hemoglobin 13.0 - 17.7 g/dL 13.5  Hematocrit 37.5 - 51.0 % 42.2  Platelets 150 - 450 x10E3/uL 131(L)    Lipid Panel  No results found for: CHOL, TRIG, HDL, CHOLHDL, VLDL, LDLCALC, LDLDIRECT HEMOGLOBIN A1C No results found for: HGBA1C, MPG TSH No results for input(s): TSH in the last 8760 hours.  PRN Meds:. There are no discontinued medications. Current Meds  Medication Sig  . aspirin EC 81 MG tablet Take 81 mg by mouth daily.   . clopidogrel (PLAVIX) 75 MG tablet Take 1 tablet (75 mg total) by mouth daily.  . diphenhydrAMINE (BENADRYL) 25 MG tablet Take 25 mg by mouth daily.  Marland Kitchen dutasteride (AVODART) 0.5 MG capsule Take 0.5 mg by mouth daily.   Marland Kitchen latanoprost (XALATAN) 0.005 % ophthalmic solution Place 1 drop into both eyes at bedtime.  Marland Kitchen losartan-hydrochlorothiazide (HYZAAR) 100-25 MG tablet Take 1 tablet by mouth daily.  . metoprolol succinate (  TOPROL XL) 25 MG 24 hr tablet Take 1 tablet (25 mg total) by mouth daily.  . rosuvastatin (CRESTOR) 20 MG tablet Take 1 tablet (20 mg total) by mouth daily.  . timolol (TIMOPTIC) 0.5 % ophthalmic solution Place 1 drop into both eyes daily.  . vitamin C (ASCORBIC ACID) 500 MG tablet Take 500 mg by mouth daily.  . Vitamin D, Cholecalciferol, 25 MCG (1000 UT) TABS Take 1,000 Units by mouth daily.     Cardiac Studies:   Coronary Angiography 03/12/19: Normal LVEF. Normal LVEDP. Ost LM lesion is 15% stenosed.  Noraml RCA and RI and RCA. Mid LAD lesion is 95% stenosedS/P BALLOON WOLVERINE 3.50X10 followed by [place] 3.5X18. Maximum pressure: 12 atm. 95% to 0%.   Lexiscan Sestamibi Stress Test 01/21/2019: Resting EKG months normal sinus rhythm, poor R-wave progression, nonspecific diffuse T abnormality.  Stress EKG non-diagnostic due to pharmacologic stress testing, There was pseudo-normalization of T inversion. No symptoms were experienced. Perfusion imaging study demonstrates a moderate-sized moderate ischemia involving the anteroseptal and apical anterolateral ischemia extending from the mid ventricle.  Left ventricle systolic function  Related by QGS was mildly depressed at 44% with anterior hypokinesis.  High risk study.  Echo- 01/23/2019 1. Normal LV systolic function with EF 58%. Left ventricle cavity is normal in size. Moderate concentric hypertrophy of the left ventricle. Normal global wall motion. Doppler evidence of grade I (impaired) diastolic dysfunction. Calculated EF 58%. 2. Mild (Grade I) mitral regurgitation. 3. Mild tricuspid regurgitation. No evidence of pulmonary hypertension.  Assessment:     ICD-10-CM   1. Atherosclerosis of native coronary artery of native heart without angina pectoris  I25.10   2. Essential hypertension  I10   3. Mild hyperlipidemia  E78.5 Lipid Profile    Comprehensive Metabolic Panel (CMET)    EKG 03/20/2019: Sinus bradycardia at 59 bpm, normal axis, T wave flattening in anterolateral leads, cannot exclude ischemia.  No changes compared to previous EKG.  Recommendations:   Patient is doing well without any symptoms of angina.  Tolerating medications well.  Blood pressure initially elevated in our office, but on recheck was well controlled and has been controlled at home.  He is aware to continue with dual antiplatelet for at least 1 year.  Encouraged him to remain with regular exercise.  He has not had recent lipid panel, would recommend repeating this for surveillance of LDL as his LDL was previously markedly elevated.  I will see him back in 6 months, but encouraged him to contact me sooner if needed.  Miquel Dunn, MSN, APRN, FNP-C [facility]. [place] Office: [phone] Fax: [phone]

## 2019-06-14 LAB — COMPREHENSIVE METABOLIC PANEL
ALT: 30 IU/L (ref 0–44)
AST: 20 IU/L (ref 0–40)
Albumin/Globulin Ratio: 1.9 (ref 1.2–2.2)
Albumin: 4.7 g/dL (ref 3.8–4.8)
Alkaline Phosphatase: 83 IU/L (ref 39–117)
BUN/Creatinine Ratio: 13 (ref 10–24)
BUN: 18 mg/dL (ref 8–27)
Bilirubin Total: 0.4 mg/dL (ref 0.0–1.2)
CO2: 25 mmol/L (ref 20–29)
Calcium: 9.6 mg/dL (ref 8.6–10.2)
Chloride: 104 mmol/L (ref 96–106)
Creatinine, Ser: 1.37 mg/dL — ABNORMAL HIGH (ref 0.76–1.27)
GFR calc Af Amer: 62 mL/min/{1.73_m2} (ref >59)
GFR calc non Af Amer: 53 mL/min/{1.73_m2} — ABNORMAL LOW (ref >59)
Globulin, Total: 2.5 g/dL (ref 1.5–4.5)
Glucose: 108 mg/dL — ABNORMAL HIGH (ref 65–99)
Potassium: 4.7 mmol/L (ref 3.5–5.2)
Sodium: 142 mmol/L (ref 134–144)
Total Protein: 7.2 g/dL (ref 6.0–8.5)

## 2019-06-14 LAB — LIPID PANEL
Chol/HDL Ratio: 2.6 ratio (ref 0.0–5.0)
Cholesterol, Total: 118 mg/dL (ref 100–199)
HDL: 46 mg/dL (ref >39)
LDL Chol Calc (NIH): 59 mg/dL (ref 0–99)
Triglycerides: 58 mg/dL (ref 0–149)
VLDL Cholesterol Cal: 13 mg/dL (ref 5–40)

## 2019-06-19 ENCOUNTER — Ambulatory Visit: Payer: Medicare Other | Admitting: Cardiology

## 2019-06-24 NOTE — Progress Notes (Signed)
LVM with details.

## 2019-07-19 ENCOUNTER — Ambulatory Visit: Payer: Medicare Other | Attending: Internal Medicine

## 2019-07-19 DIAGNOSIS — Z23 Encounter for immunization: Secondary | ICD-10-CM

## 2019-07-22 NOTE — Progress Notes (Signed)
   Covid-19 Vaccination Clinic  Name:  Sael Furches    MRN: [id number] DOB: 19-Dec-1950  07/19/2019  Mr. Marszalek was observed post Covid-19 immunization for 15 minutes without incidence. He was provided with Vaccine Information Sheet and instruction to access the V-Safe system.   Mr. Mcferran was instructed to call 911 with any severe reactions post vaccine: Marland Kitchen Difficulty breathing  . Swelling of your face and throat  . A fast heartbeat  . A bad rash all over your body  . Dizziness and weakness    Immunizations Administered    Name Date Dose VIS Date Route   Moderna COVID-19 Vaccine 07/19/2019 11:00 AM 0.5 mL 05/28/2019 Intramuscular   Manufacturer: Moderna   Lot: [id number]   NDC: 85992-341-44

## 2019-08-12 ENCOUNTER — Telehealth: Payer: Self-pay

## 2019-08-12 NOTE — Telephone Encounter (Signed)
Pt called and said that he has been having dizziness over the last week. BP reading was 111/79 heart rate 57, especially when bending over. Please advise. HR has been as low as 52

## 2019-08-12 NOTE — Telephone Encounter (Signed)
Have him cut back on Metoprolol to 1/2 tablet and see if this improves. Ensure adequate hydration

## 2019-08-13 NOTE — Telephone Encounter (Signed)
Lvm with pt stating AK recs.

## 2019-08-16 ENCOUNTER — Ambulatory Visit: Payer: Medicare Other

## 2019-08-23 ENCOUNTER — Ambulatory Visit: Payer: Medicare Other | Attending: Internal Medicine

## 2019-08-23 DIAGNOSIS — Z23 Encounter for immunization: Secondary | ICD-10-CM

## 2019-08-23 NOTE — Progress Notes (Signed)
   Covid-19 Vaccination Clinic  Name:  Bradley Parker    MRN: [id number] DOB: [birth year]  08/23/2019  [name] was observed post Covid-19 immunization for 15 minutes without incidence. He was provided with Vaccine Information Sheet and instruction to access the V-Safe system.   [name] was instructed to call 911 with any severe reactions post vaccine: Marland Kitchen Difficulty breathing  . Swelling of your face and throat  . A fast heartbeat  . A bad rash all over your body  . Dizziness and weakness    Immunizations Administered    Name Date Dose VIS Date Route   Moderna COVID-19 Vaccine 08/23/2019  8:50 AM 0.5 mL 05/28/2019 Intramuscular   Manufacturer: Moderna   Lot: [id number]   NDC: 90689-340-68

## 2019-09-06 ENCOUNTER — Other Ambulatory Visit: Payer: Self-pay | Admitting: Cardiology

## 2019-09-23 ENCOUNTER — Telehealth: Payer: Self-pay

## 2019-09-23 NOTE — Telephone Encounter (Signed)
I called patient, he stated that he is still having the dizziness, so I advised him to be seen, I transferred call to [place] front desk, to schedule appt to be seen

## 2019-10-07 ENCOUNTER — Other Ambulatory Visit: Payer: Self-pay

## 2019-10-07 ENCOUNTER — Encounter: Payer: Self-pay | Admitting: Cardiology

## 2019-10-07 ENCOUNTER — Ambulatory Visit: Payer: Medicare Other | Admitting: Cardiology

## 2019-10-07 VITALS — BP 112/72 | HR 65 | Temp 98.2°F | Resp 11 | Ht 68.0 in | Wt 202.0 lb

## 2019-10-07 DIAGNOSIS — E785 Hyperlipidemia, unspecified: Secondary | ICD-10-CM

## 2019-10-07 DIAGNOSIS — Z95820 Peripheral vascular angioplasty status with implants and grafts: Secondary | ICD-10-CM

## 2019-10-07 DIAGNOSIS — I2510 Atherosclerotic heart disease of native coronary artery without angina pectoris: Secondary | ICD-10-CM

## 2019-10-07 DIAGNOSIS — I10 Essential (primary) hypertension: Secondary | ICD-10-CM

## 2019-10-07 DIAGNOSIS — R42 Dizziness and giddiness: Secondary | ICD-10-CM

## 2019-10-07 DIAGNOSIS — R9431 Abnormal electrocardiogram [ECG] [EKG]: Secondary | ICD-10-CM

## 2019-10-07 MED ORDER — LOSARTAN POTASSIUM 100 MG PO TABS: 100.0000 mg | ORAL_TABLET | Freq: Every evening | ORAL | 1 refills | Status: DC

## 2019-10-07 NOTE — Progress Notes (Signed)
[name] Date of Birth: February 24, 1951 MRN: [id number] Primary Care Provider:Wong, Edwyna Shell, MD   Date: 10/07/19 Last Office Visit: 06/11/2019  Chief Complaint  Patient presents with  . Follow-up    dizziness    HPI  Morse Brueggemann is a 69 y.o.  male who presents to the office with a chief complaint of " dizziness." Patient's past medical history and cardiovascular risk factors include: Benign essential hypertension, established coronary artery disease status post angioplasty and stent, preglaucoma, vitamin D deficiency, obesity, advanced age.  Patient was last seen in the office on June 11, 2019 by Miquel Dunn, MSN, APRN, FNP-C.  At last office visit patient was doing well from a cardiovascular standpoint was asked to follow-up for 1 year, sooner if symptoms progress.  I am seeing the patient for the first time for the above-mentioned chief complaint.  Patient states that the dizziness started back in February 2021.  The episodes of dizziness occur randomly and no particular predisposing factor.  Patient states that the symptoms last for minutes at a time, it can occur while he sitting standing and or laying down.  They are not associated with change in position or turning his head quickly.  No changes in medications, no symptoms of diarrhea, urinary tract infection or blood loss.  Patient states that he does not keep himself well-hydrated and only consumes 1-1.5 meals per day.  No use of illicit drug use, no energy drinks, or herbal supplements.  Patient denies any symptoms of near syncope or syncope.  During the episodes of dizziness he denies any palpitations, chest pain or shortness of breath.  Patient states that he checks his blood pressure on a daily basis and did bring in his blood pressure log for review.  He checks his blood pressures both in the morning and in the evenings.  And his systolic blood pressures are usually less than 283 mmHg and diastolic blood pressures  are less than 80 mmHg.   ALLERGIES: No Known Allergies   MEDICATION LIST PRIOR TO VISIT: Current Outpatient Medications on File Prior to Visit  Medication Sig Dispense Refill  . aspirin EC 81 MG tablet Take 81 mg by mouth daily.     . clopidogrel (PLAVIX) 75 MG tablet Take 1 tablet (75 mg total) by mouth daily. 90 tablet 2  . diphenhydrAMINE (BENADRYL) 25 MG tablet Take 25 mg by mouth daily.    Marland Kitchen dutasteride (AVODART) 0.5 MG capsule Take 0.5 mg by mouth daily.     Marland Kitchen latanoprost (XALATAN) 0.005 % ophthalmic solution Place 1 drop into both eyes at bedtime.    . metoprolol succinate (TOPROL XL) 25 MG 24 hr tablet Take 1 tablet (25 mg total) by mouth daily. 30 tablet 11  . rosuvastatin (CRESTOR) 20 MG tablet TAKE 1 TABLET BY MOUTH EVERY DAY 90 tablet 1  . timolol (TIMOPTIC) 0.5 % ophthalmic solution Place 1 drop into both eyes daily.    . vitamin C (ASCORBIC ACID) 500 MG tablet Take 500 mg by mouth daily.    . Vitamin D, Cholecalciferol, 25 MCG (1000 UT) TABS Take 1,000 Units by mouth daily.     . nitroGLYCERIN (NITROSTAT) 0.4 MG SL tablet Place 1 tablet (0.4 mg total) under the tongue every 5 (five) minutes as needed for up to 25 days for chest pain. 25 tablet 3   No current facility-administered medications on file prior to visit.    PAST MEDICAL HISTORY: Past Medical History:  Diagnosis Date  .  Coronary artery disease   . HTN (hypertension) 01/04/2019  . Hyperlipidemia     PAST SURGICAL HISTORY: Past Surgical History:  Procedure Laterality Date  . CARDIAC CATHETERIZATION    . CATARACT EXTRACTION Left 01/29/2019  . CORONARY ANGIOPLASTY WITH STENT PLACEMENT    . CORONARY STENT INTERVENTION N/A 03/12/2019   Procedure: CORONARY STENT INTERVENTION;  Surgeon: Adrian Prows, MD;  Location: [facility];  Service: Cardiovascular;  Laterality: N/A;  . HERNIA REPAIR    . LEFT HEART CATH AND CORONARY ANGIOGRAPHY N/A 03/12/2019   Procedure: LEFT HEART CATH AND CORONARY ANGIOGRAPHY;   Surgeon: Adrian Prows, MD;  Location: [facility];  Service: Cardiovascular;  Laterality: N/A;    FAMILY HISTORY: The patient's family history includes Diabetes in his mother; Hypertension in his father and mother.   SOCIAL HISTORY:  The patient  reports that he has never smoked. He has never used smokeless tobacco. He reports that he does not drink alcohol or use drugs.  Review of Systems  Constitution: Negative for chills and fever.  HENT: Negative for ear discharge, ear pain and nosebleeds.   Eyes: Negative for blurred vision and discharge.  Cardiovascular: Negative for chest pain, claudication, dyspnea on exertion, leg swelling, near-syncope, orthopnea, palpitations, paroxysmal nocturnal dyspnea and syncope.  Respiratory: Negative for cough and shortness of breath.   Endocrine: Negative for polydipsia, polyphagia and polyuria.  Hematologic/Lymphatic: Negative for bleeding problem.  Skin: Negative for flushing and nail changes.  Musculoskeletal: Negative for muscle cramps, muscle weakness and myalgias.  Gastrointestinal: Negative for abdominal pain, dysphagia, hematemesis, hematochezia, melena, nausea and vomiting.  Neurological: Positive for dizziness. Negative for focal weakness and light-headedness.    PHYSICAL EXAM: Vitals with BMI 10/07/2019 10/07/2019 06/11/2019  Height '5\' 8"'  '5\' 8"'  -  Weight 202 lbs 202 lbs -  BMI 84.69 62.95 -  Systolic 284 - 132  Diastolic 72 - 82  Pulse 65 - -   Orthostatic VS for the past 72 hrs (Last 3 readings):  Orthostatic BP Patient Position BP Location Cuff Size Orthostatic Pulse  10/07/19 1454 -- Sitting Left Arm Large --  10/07/19 1436 112/79 Standing Left Arm Large 64  10/07/19 1435 114/84 Sitting Left Arm Large 60  10/07/19 1434 99/75 Supine Left Arm Large 57   CONSTITUTIONAL: Well-developed and well-nourished. No acute distress.  SKIN: Skin is warm and dry. No rash noted. No cyanosis. No pallor. No jaundice HEAD: Normocephalic and  atraumatic.  EYES: No scleral icterus MOUTH/THROAT: Moist oral membranes.  NECK: No JVD present. No thyromegaly noted. No carotid bruits  LYMPHATIC: No visible cervical adenopathy.  CHEST Normal respiratory effort. No intercostal retractions  LUNGS: Clear to auscultation bilaterally.  No stridor. No wheezes. No rales.  CARDIOVASCULAR: Regular rate and rhythm, positive S1-S2, no murmurs rubs or gallops appreciated. ABDOMINAL: Soft, nontender, nondistended, positive bowel sounds were quadrants.  No apparent ascites.  EXTREMITIES: No peripheral edema  HEMATOLOGIC: No significant bruising NEUROLOGIC: Oriented to person, place, and time. Nonfocal. Normal muscle tone.  PSYCHIATRIC: Normal mood and affect. Normal behavior. Cooperative  CARDIAC DATABASE: EKG: 10/07/2019: Sinus bradycardia with a ventricular rate of 53 bpm T wave inversions and T wave flattening in the inferolateral leads.  Prior EKG from 03/20/2019 reports sinus bradycardia with T wave flattening in leads III, aVF, V4-V6.  Echocardiogram: 01/23/2019: LVEF 58%, moderate concentric LVH, grade 1 diastolic impairment, mild MR, mild TR, no evidence of pulmonary hypertension.  Stress Testing:  Lexiscan Sestamibi Stress Test 01/21/2019: Resting EKG months  normal sinus rhythm, poor R-wave progression, nonspecific diffuse T abnormality.  Stress EKG non-diagnostic due to pharmacologic stress testing, There was pseudo-normalization of T inversion. No symptoms were experienced. Perfusion imaging study demonstrates a moderate-sized moderate ischemia involving the anteroseptal and apical anterolateral ischemia extending from the mid ventricle.  Left ventricle systolic function Related by QGS was mildly depressed at 44% with anterior hypokinesis.  High risk study.  Heart Catheterization: 03/12/19: Normal LVEF. Normal LVEDP. Ost LM lesion is 15% stenosed.  Noraml RCA and RI and RCA. Mid LAD lesion is 95% stenosedS/P BALLOON WOLVERINE 3.50X10  followed by [place] 3.5X18. Maximum pressure: 12 atm. 95% to 0%.   LABORATORY DATA: CBC Latest Ref Rng & Units 03/01/2019  WBC 3.4 - 10.8 x10E3/uL 5.5  Hemoglobin 13.0 - 17.7 g/dL 13.5  Hematocrit 37.5 - 51.0 % 42.2  Platelets 150 - 450 x10E3/uL 131(L)    CMP Latest Ref Rng & Units 06/13/2019 03/01/2019  Glucose 65 - 99 mg/dL 108(H) 117(H)  BUN 8 - 27 mg/dL 18 19  Creatinine 0.76 - 1.27 mg/dL 1.37(H) 1.41(H)  Sodium 134 - 144 mmol/L 142 142  Potassium 3.5 - 5.2 mmol/L 4.7 4.5  Chloride 96 - 106 mmol/L 104 106  CO2 20 - 29 mmol/L 25 23  Calcium 8.6 - 10.2 mg/dL 9.6 9.6  Total Protein 6.0 - 8.5 g/dL 7.2 -  Total Bilirubin 0.0 - 1.2 mg/dL 0.4 -  Alkaline Phos 39 - 117 IU/L 83 -  AST 0 - 40 IU/L 20 -  ALT 0 - 44 IU/L 30 -    Lipid Panel     Component Value Date/Time   CHOL 118 06/13/2019 0900   TRIG 58 06/13/2019 0900   HDL 46 06/13/2019 0900   CHOLHDL 2.6 06/13/2019 0900   LDLCALC 59 06/13/2019 0900   LABVLDL 13 06/13/2019 0900    No results found for: HGBA1C No components found for: NTPROBNP No results found for: TSH  Cardiac Panel (last 3 results) No results for input(s): CKTOTAL, CKMB, TROPONINIHS, RELINDX in the last 72 hours.  FINAL MEDICATION LIST END OF ENCOUNTER: Meds ordered this encounter  Medications  . losartan (COZAAR) 100 MG tablet    Sig: Take 1 tablet (100 mg total) by mouth every evening.    Dispense:  90 tablet    Refill:  1    Medications Discontinued During This Encounter  Medication Reason  . losartan-hydrochlorothiazide (HYZAAR) 100-25 MG tablet Change in therapy     Current Outpatient Medications:  .  aspirin EC 81 MG tablet, Take 81 mg by mouth daily. , Disp: , Rfl:  .  clopidogrel (PLAVIX) 75 MG tablet, Take 1 tablet (75 mg total) by mouth daily., Disp: 90 tablet, Rfl: 2 .  diphenhydrAMINE (BENADRYL) 25 MG tablet, Take 25 mg by mouth daily., Disp: , Rfl:  .  dutasteride (AVODART) 0.5 MG capsule, Take 0.5 mg by mouth daily. ,  Disp: , Rfl:  .  latanoprost (XALATAN) 0.005 % ophthalmic solution, Place 1 drop into both eyes at bedtime., Disp: , Rfl:  .  metoprolol succinate (TOPROL XL) 25 MG 24 hr tablet, Take 1 tablet (25 mg total) by mouth daily., Disp: 30 tablet, Rfl: 11 .  rosuvastatin (CRESTOR) 20 MG tablet, TAKE 1 TABLET BY MOUTH EVERY DAY, Disp: 90 tablet, Rfl: 1 .  timolol (TIMOPTIC) 0.5 % ophthalmic solution, Place 1 drop into both eyes daily., Disp: , Rfl:  .  vitamin C (ASCORBIC ACID) 500 MG tablet, Take 500  mg by mouth daily., Disp: , Rfl:  .  Vitamin D, Cholecalciferol, 25 MCG (1000 UT) TABS, Take 1,000 Units by mouth daily. , Disp: , Rfl:  .  losartan (COZAAR) 100 MG tablet, Take 1 tablet (100 mg total) by mouth every evening., Disp: 90 tablet, Rfl: 1 .  nitroGLYCERIN (NITROSTAT) 0.4 MG SL tablet, Place 1 tablet (0.4 mg total) under the tongue every 5 (five) minutes as needed for up to 25 days for chest pain., Disp: 25 tablet, Rfl: 3  IMPRESSION:    ICD-10-CM   1. Dizziness  R42 EKG 12-Lead    losartan (COZAAR) 100 MG tablet  2. Essential hypertension  I10 losartan (COZAAR) 100 MG tablet  3. Atherosclerosis of native coronary artery of native heart without angina pectoris  I25.10 EKG 12-Lead    PCV MYOCARDIAL PERFUSION WITH LEXISCAN  4. Status post angioplasty with stent  Z95.820 EKG 12-Lead    PCV MYOCARDIAL PERFUSION WITH LEXISCAN  5. Mild hyperlipidemia  E78.5   6. Abnormal EKG  R94.31 PCV MYOCARDIAL PERFUSION WITH LEXISCAN     RECOMMENDATIONS: Gagandeep Pettet is a 69 y.o. male whose past medical history and cardiovascular risk factors include: Benign essential hypertension, established coronary artery disease status post angioplasty and stent, preglaucoma, vitamin D deficiency, obesity, advanced age.  Dizziness:  Patient symptoms of dizziness have been ongoing since February 2021.  Patient's blood pressures as per his log illustrates that his systolic blood pressures have been consistently less  than 120 mmHg.  He has had majority of his blood pressure readings in 100 110 mmHg.  Recommend discontinuing losartan/hydrochlorothiazide.  We will refill losartan only at 100 mg p.o. daily.  Patient is asked to stay well-hydrated.  He is also instructed to have 3 balanced meals on a daily basis.  Orthostatic vital signs negative.  Given his symptoms of dizziness patient is concerned that he may have underlying atrial fibrillation as he seen multiple TV commercials.  We will proceed with a monitor once ischemic work-up is complete and also reevaluate his symptoms after the antihypertensive medications have been changed.  Patient is agreeable with the current plan of care.  Established coronary artery disease without angina pectoris with prior angioplasty and stent:  Given his symptoms of dizziness EKG was performed which notes new T wave inversions as described above.  Given his history of CAD with prior intervention recommend nuclear stress test to rule out reversible ischemia.   Continue dual antiplatelet therapy.  Mixed hyperlipidemia: . Continue statin therapy.   . Follow lipids. . Patient denies myalgia or other side effects.  Orders Placed This Encounter  Procedures  . PCV MYOCARDIAL PERFUSION WITH LEXISCAN  . EKG 12-Lead    --Continue cardiac medications as reconciled in final medication list. --Return in about 4 weeks (around 11/04/2019) for Discussion of test results, reeval dizziness, . Or sooner if needed. --Continue follow-up with your primary care physician regarding the management of your other chronic comorbid conditions.  Patient's questions and concerns were addressed to his satisfaction. He voices understanding of the instructions provided during this encounter.   During this visit I reviewed and updated: Tobacco history  allergies medication reconciliation  medical history  surgical history  family history  social history.  This note was created using a  voice recognition software as a result there may be grammatical errors inadvertently enclosed that do not reflect the nature of this encounter. Every attempt is made to correct such errors.  Issabella Rix Terri Skains, DO, [facility]  Pager:  [phone] Office: [phone]

## 2019-10-14 ENCOUNTER — Other Ambulatory Visit: Payer: Medicare Other

## 2019-10-18 ENCOUNTER — Other Ambulatory Visit: Payer: Self-pay

## 2019-10-18 ENCOUNTER — Ambulatory Visit: Payer: Medicare Other

## 2019-10-18 DIAGNOSIS — I2510 Atherosclerotic heart disease of native coronary artery without angina pectoris: Secondary | ICD-10-CM

## 2019-10-18 DIAGNOSIS — R9431 Abnormal electrocardiogram [ECG] [EKG]: Secondary | ICD-10-CM

## 2019-10-18 DIAGNOSIS — Z95820 Peripheral vascular angioplasty status with implants and grafts: Secondary | ICD-10-CM

## 2019-10-22 NOTE — Progress Notes (Signed)
Left voice message to call back

## 2019-11-04 ENCOUNTER — Ambulatory Visit: Payer: Medicare Other | Admitting: Cardiology

## 2019-11-04 ENCOUNTER — Encounter: Payer: Self-pay | Admitting: Cardiology

## 2019-11-04 ENCOUNTER — Other Ambulatory Visit: Payer: Self-pay

## 2019-11-04 VITALS — BP 148/96 | HR 61 | Temp 98.1°F | Resp 18 | Ht 68.0 in | Wt 200.6 lb

## 2019-11-04 DIAGNOSIS — Z712 Person consulting for explanation of examination or test findings: Secondary | ICD-10-CM

## 2019-11-04 DIAGNOSIS — E785 Hyperlipidemia, unspecified: Secondary | ICD-10-CM

## 2019-11-04 DIAGNOSIS — Z95820 Peripheral vascular angioplasty status with implants and grafts: Secondary | ICD-10-CM

## 2019-11-04 DIAGNOSIS — I2510 Atherosclerotic heart disease of native coronary artery without angina pectoris: Secondary | ICD-10-CM

## 2019-11-04 DIAGNOSIS — I10 Essential (primary) hypertension: Secondary | ICD-10-CM

## 2019-11-04 DIAGNOSIS — R42 Dizziness and giddiness: Secondary | ICD-10-CM

## 2019-11-04 MED ORDER — HYDROCHLOROTHIAZIDE 12.5 MG PO CAPS: 12.5000 mg | ORAL_CAPSULE | Freq: Every morning | ORAL | 2 refills | Status: DC

## 2019-11-04 NOTE — Patient Instructions (Signed)
Please remember to bring in your medication bottles in at the next visit.   New Medications that were added at today's visit:  Hydrochlorothiazide 12.5 mg p.o. every morning.  Take metoprolol in the morning. Take losartan in the evening.  Please get labs done on or after Nov 11, 2019 at the nearest [facility].  Recommend follow up with your PCP as scheduled.

## 2019-11-04 NOTE — Progress Notes (Signed)
Bradley Parker Date of Birth: 12-May-1951 MRN: [id number] Primary Care Provider:Wong, Edwyna Shell, MD   Date: 11/04/19 Last Office Visit: 10/07/2019  Chief Complaint  Patient presents with  . Dizziness  . Follow-up    4 weeks  . Results    stress test    HPI  Bradley Parker is a 69 y.o.  male who presents to the office with a chief complaint of " follow-up in regards to dizziness and reviewing test results." Patient's past medical history and cardiovascular risk factors include: Benign essential hypertension, established coronary artery disease status post angioplasty and stent, preglaucoma, vitamin D deficiency, obesity, advanced age.  Former patient of Miquel Dunn, MSN, APRN, FNP-C and re-established care with myself back in April 2021.     At the last office visit patient was complaining of episodes of dizziness that were occurring randomly without any predisposing factors.  Patient was keeping a log of his blood pressure and his systolic blood pressures were consistently less than 950 mmHg and diastolic blood pressures were less than 80 mmHg.  He was taking all of his blood pressure medications at once in the morning.  At the last office visit I split the medication so that he is taking a few in the morning and few in the evening and also held his hydrochlorothiazide.  Patient has to had an abnormal EKG for which he was recommended to undergo stress test given his prior cardiac history.  Since last office visit patient states that he feels great and has not had any episodes of dizziness with changing positions or bending forward.  Patient has been keeping a log of his blood pressures and his systolic blood pressures are now stable and in fact greater than 140 mmHg at times.  Nuclear stress test results reviewed with the patient in great detail and noted below for further reference.  ALLERGIES: No Known Allergies   MEDICATION LIST PRIOR TO VISIT: Current Outpatient  Medications on File Prior to Visit  Medication Sig Dispense Refill  . aspirin EC 81 MG tablet Take 81 mg by mouth daily.     . clopidogrel (PLAVIX) 75 MG tablet Take 1 tablet (75 mg total) by mouth daily. 90 tablet 2  . diphenhydrAMINE (BENADRYL) 25 MG tablet Take 25 mg by mouth daily.    Marland Kitchen dutasteride (AVODART) 0.5 MG capsule Take 0.5 mg by mouth daily.     Marland Kitchen latanoprost (XALATAN) 0.005 % ophthalmic solution Place 1 drop into both eyes at bedtime.    Marland Kitchen losartan (COZAAR) 100 MG tablet Take 1 tablet (100 mg total) by mouth every evening. 90 tablet 1  . metoprolol succinate (TOPROL XL) 25 MG 24 hr tablet Take 1 tablet (25 mg total) by mouth daily. 30 tablet 11  . rosuvastatin (CRESTOR) 20 MG tablet TAKE 1 TABLET BY MOUTH EVERY DAY 90 tablet 1  . timolol (TIMOPTIC) 0.5 % ophthalmic solution Place 1 drop into both eyes daily.    . vitamin C (ASCORBIC ACID) 500 MG tablet Take 500 mg by mouth daily.    . Vitamin D, Cholecalciferol, 25 MCG (1000 UT) TABS Take 1,000 Units by mouth daily.     . nitroGLYCERIN (NITROSTAT) 0.4 MG SL tablet Place 1 tablet (0.4 mg total) under the tongue every 5 (five) minutes as needed for up to 25 days for chest pain. 25 tablet 3   No current facility-administered medications on file prior to visit.    PAST MEDICAL HISTORY: Past Medical History:  Diagnosis  Date  . Coronary artery disease   . HTN (hypertension) 01/04/2019  . Hyperlipidemia     PAST SURGICAL HISTORY: Past Surgical History:  Procedure Laterality Date  . CARDIAC CATHETERIZATION    . CATARACT EXTRACTION Left 01/29/2019  . CORONARY ANGIOPLASTY WITH STENT PLACEMENT    . CORONARY STENT INTERVENTION N/A 03/12/2019   Procedure: CORONARY STENT INTERVENTION;  Surgeon: Adrian Prows, MD;  Location: [facility];  Service: Cardiovascular;  Laterality: N/A;  . HERNIA REPAIR    . LEFT HEART CATH AND CORONARY ANGIOGRAPHY N/A 03/12/2019   Procedure: LEFT HEART CATH AND CORONARY ANGIOGRAPHY;  Surgeon: Adrian Prows, MD;  Location: [facility];  Service: Cardiovascular;  Laterality: N/A;    FAMILY HISTORY: The patient's family history includes Diabetes in his mother; Hypertension in his father and mother.   SOCIAL HISTORY:  The patient  reports that he has never smoked. He has never used smokeless tobacco. He reports that he does not drink alcohol or use drugs.  Review of Systems  Constitution: Negative for chills and fever.  HENT: Negative for ear discharge, ear pain and nosebleeds.   Eyes: Negative for blurred vision and discharge.  Cardiovascular: Negative for chest pain, claudication, dyspnea on exertion, leg swelling, near-syncope, orthopnea, palpitations, paroxysmal nocturnal dyspnea and syncope.  Respiratory: Negative for cough and shortness of breath.   Endocrine: Negative for polydipsia, polyphagia and polyuria.  Hematologic/Lymphatic: Negative for bleeding problem.  Skin: Negative for flushing and nail changes.  Musculoskeletal: Negative for muscle cramps, muscle weakness and myalgias.  Gastrointestinal: Negative for abdominal pain, dysphagia, hematemesis, hematochezia, melena, nausea and vomiting.  Neurological: Negative for dizziness, focal weakness and light-headedness.    PHYSICAL EXAM: Vitals with BMI 11/04/2019 10/07/2019 10/07/2019  Height '5\' 8"'  '5\' 8"'  '5\' 8"'   Weight 200 lbs 10 oz 202 lbs 202 lbs  BMI 30.51 38.46 65.99  Systolic 357 017 -  Diastolic 96 72 -  Pulse 61 65 -   Orthostatic VS for the past 72 hrs (Last 3 readings):  Patient Position BP Location Cuff Size  11/04/19 1434 Sitting Left Arm Large   CONSTITUTIONAL: Well-developed and well-nourished. No acute distress.  SKIN: Skin is warm and dry. No rash noted. No cyanosis. No pallor. No jaundice HEAD: Normocephalic and atraumatic.  EYES: No scleral icterus MOUTH/THROAT: Moist oral membranes.  NECK: No JVD present. No thyromegaly noted. No carotid bruits  LYMPHATIC: No visible cervical adenopathy.  CHEST  Normal respiratory effort. No intercostal retractions  LUNGS: Clear to auscultation bilaterally.  No stridor. No wheezes. No rales.  CARDIOVASCULAR: Regular rate and rhythm, positive S1-S2, no murmurs rubs or gallops appreciated. ABDOMINAL: Soft, nontender, nondistended, positive bowel sounds were quadrants.  No apparent ascites.  EXTREMITIES: No peripheral edema  HEMATOLOGIC: No significant bruising NEUROLOGIC: Oriented to person, place, and time. Nonfocal. Normal muscle tone.  PSYCHIATRIC: Normal mood and affect. Normal behavior. Cooperative  CARDIAC DATABASE: EKG: 10/07/2019: Sinus bradycardia with a ventricular rate of 53 bpm T wave inversions and T wave flattening in the inferolateral leads.  Prior EKG from 03/20/2019 reports sinus bradycardia with T wave flattening in leads III, aVF, V4-V6.  Echocardiogram: 01/23/2019: LVEF 58%, moderate concentric LVH, grade 1 diastolic impairment, mild MR, mild TR, no evidence of pulmonary hypertension.  Stress Testing:  Lexiscan/modified Bruce Tetrofosmin stress test 10/18/2019: Normal myocardial perfusion imaging. Stress LVEF 63%. Low risk study.   Heart Catheterization: 03/12/19: Normal LVEF. Normal LVEDP. Ost LM lesion is 15% stenosed.  Noraml RCA and RI  and RCA. Mid LAD lesion is 95% stenosedS/P BALLOON WOLVERINE 3.50X10 followed by Ovid 3.5X18. Maximum pressure: 12 atm. 95% to 0%.   LABORATORY DATA: CBC Latest Ref Rng & Units 03/01/2019  WBC 3.4 - 10.8 x10E3/uL 5.5  Hemoglobin 13.0 - 17.7 g/dL 13.5  Hematocrit 37.5 - 51.0 % 42.2  Platelets 150 - 450 x10E3/uL 131(L)    CMP Latest Ref Rng & Units 06/13/2019 03/01/2019  Glucose 65 - 99 mg/dL 108(H) 117(H)  BUN 8 - 27 mg/dL 18 19  Creatinine 0.76 - 1.27 mg/dL 1.37(H) 1.41(H)  Sodium 134 - 144 mmol/L 142 142  Potassium 3.5 - 5.2 mmol/L 4.7 4.5  Chloride 96 - 106 mmol/L 104 106  CO2 20 - 29 mmol/L 25 23  Calcium 8.6 - 10.2 mg/dL 9.6 9.6  Total Protein 6.0 - 8.5 g/dL 7.2 -  Total  Bilirubin 0.0 - 1.2 mg/dL 0.4 -  Alkaline Phos 39 - 117 IU/L 83 -  AST 0 - 40 IU/L 20 -  ALT 0 - 44 IU/L 30 -    Lipid Panel     Component Value Date/Time   CHOL 118 06/13/2019 0900   TRIG 58 06/13/2019 0900   HDL 46 06/13/2019 0900   CHOLHDL 2.6 06/13/2019 0900   LDLCALC 59 06/13/2019 0900   LABVLDL 13 06/13/2019 0900    No results found for: HGBA1C No components found for: NTPROBNP No results found for: TSH  Cardiac Panel (last 3 results) No results for input(s): CKTOTAL, CKMB, TROPONINIHS, RELINDX in the last 72 hours.  FINAL MEDICATION LIST END OF ENCOUNTER: Meds ordered this encounter  Medications  . hydrochlorothiazide (MICROZIDE) 12.5 MG capsule    Sig: Take 1 capsule (12.5 mg total) by mouth in the morning.    Dispense:  30 capsule    Refill:  2    There are no discontinued medications.   Current Outpatient Medications:  .  aspirin EC 81 MG tablet, Take 81 mg by mouth daily. , Disp: , Rfl:  .  clopidogrel (PLAVIX) 75 MG tablet, Take 1 tablet (75 mg total) by mouth daily., Disp: 90 tablet, Rfl: 2 .  diphenhydrAMINE (BENADRYL) 25 MG tablet, Take 25 mg by mouth daily., Disp: , Rfl:  .  dutasteride (AVODART) 0.5 MG capsule, Take 0.5 mg by mouth daily. , Disp: , Rfl:  .  latanoprost (XALATAN) 0.005 % ophthalmic solution, Place 1 drop into both eyes at bedtime., Disp: , Rfl:  .  losartan (COZAAR) 100 MG tablet, Take 1 tablet (100 mg total) by mouth every evening., Disp: 90 tablet, Rfl: 1 .  metoprolol succinate (TOPROL XL) 25 MG 24 hr tablet, Take 1 tablet (25 mg total) by mouth daily., Disp: 30 tablet, Rfl: 11 .  rosuvastatin (CRESTOR) 20 MG tablet, TAKE 1 TABLET BY MOUTH EVERY DAY, Disp: 90 tablet, Rfl: 1 .  timolol (TIMOPTIC) 0.5 % ophthalmic solution, Place 1 drop into both eyes daily., Disp: , Rfl:  .  vitamin C (ASCORBIC ACID) 500 MG tablet, Take 500 mg by mouth daily., Disp: , Rfl:  .  Vitamin D, Cholecalciferol, 25 MCG (1000 UT) TABS, Take 1,000 Units by mouth  daily. , Disp: , Rfl:  .  hydrochlorothiazide (MICROZIDE) 12.5 MG capsule, Take 1 capsule (12.5 mg total) by mouth in the morning., Disp: 30 capsule, Rfl: 2 .  nitroGLYCERIN (NITROSTAT) 0.4 MG SL tablet, Place 1 tablet (0.4 mg total) under the tongue every 5 (five) minutes as needed for up to 25 days  for chest pain., Disp: 25 tablet, Rfl: 3  IMPRESSION:    ICD-10-CM   1. Encounter to discuss test results  Z71.2   2. Essential hypertension  I10 hydrochlorothiazide (MICROZIDE) 12.5 MG capsule    Magnesium    Basic metabolic panel  3. Atherosclerosis of native coronary artery of native heart without angina pectoris  I25.10   4. Status post angioplasty with stent  Z95.820   5. Mild hyperlipidemia  E78.5   6. Dizziness  R42      RECOMMENDATIONS: Bradley Parker is a 69 y.o. male whose past medical history and cardiovascular risk factors include: Benign essential hypertension, established coronary artery disease status post angioplasty and stent, preglaucoma, vitamin D deficiency, obesity, advanced age.  Dizziness: Resolved  Benign essential hypertension:  Restart hydrochlorothiazide at 12.5 mg p.o. every morning.  Patient instructed to take his losartan in the evening hours.  Patient instructed to take his Toprol XL in the morning.  Continue keeping a log of his blood pressures and to call the office if his systolic blood pressures are consistently greater than 140 mmHg.  Recheck BMP in 1 week after initiating diuretic therapy.  Established coronary artery disease without angina pectoris with prior angioplasty and stent:  Patient denies any anginal symptoms.  Most recent nuclear stress test results reviewed with the patient at today's office visit and noted above for further reference.  Continue dual antiplatelet therapy.  Mixed hyperlipidemia: . Continue statin therapy.   . Follow lipids. . Patient denies myalgia or other side effects.  Orders Placed This Encounter    Procedures  . Magnesium  . Basic metabolic panel    --Continue cardiac medications as reconciled in final medication list. --Return in about 6 months (around 05/06/2020) for re-evaluation of symptoms.. Or sooner if needed. --Continue follow-up with your primary care physician regarding the management of your other chronic comorbid conditions.  Patient's questions and concerns were addressed to his satisfaction. He voices understanding of the instructions provided during this encounter.   This note was created using a voice recognition software as a result there may be grammatical errors inadvertently enclosed that do not reflect the nature of this encounter. Every attempt is made to correct such errors.  Rex Kras, [place], [facility]  Pager: [phone] Office: [phone]

## 2019-11-12 LAB — MAGNESIUM: Magnesium: 2.1 mg/dL (ref 1.6–2.3)

## 2019-11-12 LAB — BASIC METABOLIC PANEL
BUN/Creatinine Ratio: 11 (ref 10–24)
BUN: 15 mg/dL (ref 8–27)
CO2: 22 mmol/L (ref 20–29)
Calcium: 9.8 mg/dL (ref 8.6–10.2)
Chloride: 104 mmol/L (ref 96–106)
Creatinine, Ser: 1.38 mg/dL — ABNORMAL HIGH (ref 0.76–1.27)
GFR calc Af Amer: 61 mL/min/{1.73_m2} (ref >59)
GFR calc non Af Amer: 53 mL/min/{1.73_m2} — ABNORMAL LOW (ref >59)
Glucose: 113 mg/dL — ABNORMAL HIGH (ref 65–99)
Potassium: 4.3 mmol/L (ref 3.5–5.2)
Sodium: 141 mmol/L (ref 134–144)

## 2019-11-12 NOTE — Progress Notes (Signed)
Left vm to cb.

## 2019-11-14 ENCOUNTER — Other Ambulatory Visit: Payer: Self-pay | Admitting: Cardiology

## 2019-11-14 ENCOUNTER — Telehealth: Payer: Self-pay

## 2019-11-14 NOTE — Telephone Encounter (Signed)
-----   Message from Andrew Au, [place] sent at 11/12/2019 11:33 AM EDT ----- Left vm to cb.

## 2019-12-10 ENCOUNTER — Ambulatory Visit: Payer: Medicare Other | Admitting: Cardiology

## 2020-01-23 ENCOUNTER — Other Ambulatory Visit: Payer: Self-pay | Admitting: Cardiology

## 2020-01-29 ENCOUNTER — Other Ambulatory Visit: Payer: Self-pay | Admitting: Cardiology

## 2020-01-29 DIAGNOSIS — I10 Essential (primary) hypertension: Secondary | ICD-10-CM

## 2020-03-02 ENCOUNTER — Other Ambulatory Visit: Payer: Self-pay | Admitting: Cardiology

## 2020-04-02 ENCOUNTER — Other Ambulatory Visit: Payer: Self-pay

## 2020-04-02 MED ORDER — METOPROLOL SUCCINATE ER 25 MG PO TB24: 25.0000 mg | ORAL_TABLET | Freq: Every day | ORAL | 2 refills | Status: DC

## 2020-04-03 ENCOUNTER — Other Ambulatory Visit: Payer: Self-pay | Admitting: Cardiology

## 2020-04-03 DIAGNOSIS — R42 Dizziness and giddiness: Secondary | ICD-10-CM

## 2020-04-03 DIAGNOSIS — I10 Essential (primary) hypertension: Secondary | ICD-10-CM

## 2020-05-06 ENCOUNTER — Ambulatory Visit: Payer: Medicare Other | Admitting: Cardiology

## 2020-05-06 ENCOUNTER — Encounter: Payer: Self-pay | Admitting: Cardiology

## 2020-05-06 ENCOUNTER — Other Ambulatory Visit: Payer: Self-pay

## 2020-05-06 VITALS — BP 131/93 | HR 59 | Ht 68.0 in | Wt 201.0 lb

## 2020-05-06 DIAGNOSIS — Z95820 Peripheral vascular angioplasty status with implants and grafts: Secondary | ICD-10-CM

## 2020-05-06 DIAGNOSIS — R42 Dizziness and giddiness: Secondary | ICD-10-CM

## 2020-05-06 DIAGNOSIS — I2510 Atherosclerotic heart disease of native coronary artery without angina pectoris: Secondary | ICD-10-CM

## 2020-05-06 DIAGNOSIS — I10 Essential (primary) hypertension: Secondary | ICD-10-CM

## 2020-05-06 DIAGNOSIS — E785 Hyperlipidemia, unspecified: Secondary | ICD-10-CM

## 2020-05-06 NOTE — Progress Notes (Signed)
Bradley Parker Date of Birth: [birth year] MRN: [id number] Primary Care Provider:Wong, Edwyna Shell, MD   Date: 05/06/20 Last Office Visit: 11/04/2019  Chief Complaint  Patient presents with   Follow-up   Hypertension   Coronary Artery Disease    HPI  Bradley Parker is a 69 y.o.  male who presents to the office with a chief complaint of " blood pressure and heart disease management." Patient's past medical history and cardiovascular risk factors include: Benign essential hypertension, established coronary artery disease status post angioplasty and stent, preglaucoma, vitamin D deficiency, obesity, advanced age.  Former patient of Miquel Dunn, MSN, APRN, FNP-C and re-established care with myself back in April 2021.     At last office visit patient was having symptoms of dizziness which was most likely attributed to his medical therapy for hypertension.  His medications were titrated and since then patient states that he is doing well.  He brings in his blood pressure log for me to review and his average blood pressures usually range less than 120 mmHg over 80 mmHg.  He is no longer dizzy since the medication titration.  Given his history of coronary artery disease with prior angioplasty and stents patient has been followed up periodically.  No hospitalizations or urgent care visits for cardiovascular symptoms since last office encounter.  No use of sublingual nitroglycerin tablets.  His overall functional status remains stable.  ALLERGIES: No Known Allergies   MEDICATION LIST PRIOR TO VISIT: Current Outpatient Medications on File Prior to Visit  Medication Sig Dispense Refill   aspirin EC 81 MG tablet Take 81 mg by mouth daily.      brimonidine-timolol (COMBIGAN) 0.2-0.5 % ophthalmic solution Place 1 drop into both eyes every 12 (twelve) hours.     clopidogrel (PLAVIX) 75 MG tablet TAKE 1 TABLET BY MOUTH EVERY DAY 90 tablet 2   diphenhydrAMINE (BENADRYL) 25 MG tablet  Take 25 mg by mouth daily.     dutasteride (AVODART) 0.5 MG capsule Take 0.5 mg by mouth daily.      losartan (COZAAR) 100 MG tablet TAKE 1 TABLET (100 MG TOTAL) BY MOUTH EVERY EVENING. 90 tablet 1   metoprolol succinate (TOPROL XL) 25 MG 24 hr tablet Take 1 tablet (25 mg total) by mouth daily. 30 tablet 2   nitroGLYCERIN (NITROSTAT) 0.4 MG SL tablet Place 1 tablet (0.4 mg total) under the tongue every 5 (five) minutes as needed for up to 25 days for chest pain. 25 tablet 3   rosuvastatin (CRESTOR) 20 MG tablet TAKE 1 TABLET BY MOUTH EVERY DAY 90 tablet 1   vitamin C (ASCORBIC ACID) 500 MG tablet Take 500 mg by mouth daily.     Vitamin D, Cholecalciferol, 25 MCG (1000 UT) TABS Take 1,000 Units by mouth daily.      hydrochlorothiazide (MICROZIDE) 12.5 MG capsule TAKE 1 CAPSULE (12.5 MG TOTAL) BY MOUTH IN THE MORNING. 90 capsule 3   No current facility-administered medications on file prior to visit.    PAST MEDICAL HISTORY: Past Medical History:  Diagnosis Date   Coronary artery disease    HTN (hypertension) 01/04/2019   Hyperlipidemia     PAST SURGICAL HISTORY: Past Surgical History:  Procedure Laterality Date   CARDIAC CATHETERIZATION     CATARACT EXTRACTION Left 01/29/2019   CORONARY ANGIOPLASTY WITH STENT PLACEMENT     CORONARY STENT INTERVENTION N/A 03/12/2019   Procedure: CORONARY STENT INTERVENTION;  Surgeon: Adrian Prows, MD;  Location: [facility];  Service:  Cardiovascular;  Laterality: N/A;   HERNIA REPAIR     LEFT HEART CATH AND CORONARY ANGIOGRAPHY N/A 03/12/2019   Procedure: LEFT HEART CATH AND CORONARY ANGIOGRAPHY;  Surgeon: Adrian Prows, MD;  Location: [facility];  Service: Cardiovascular;  Laterality: N/A;    FAMILY HISTORY: The patient's family history includes Diabetes in his mother; Hypertension in his father and mother.   SOCIAL HISTORY:  The patient  reports that he has never smoked. He has never used smokeless tobacco. He reports  that he does not drink alcohol and does not use drugs.  Review of Systems  Constitutional: Negative for chills and fever.  HENT: Negative for ear discharge, ear pain and nosebleeds.   Eyes: Negative for blurred vision and discharge.  Cardiovascular: Negative for chest pain, claudication, dyspnea on exertion, leg swelling, near-syncope, orthopnea, palpitations, paroxysmal nocturnal dyspnea and syncope.  Respiratory: Negative for cough and shortness of breath.   Endocrine: Negative for polydipsia, polyphagia and polyuria.  Hematologic/Lymphatic: Negative for bleeding problem.  Skin: Negative for flushing and nail changes.  Musculoskeletal: Negative for muscle cramps, muscle weakness and myalgias.  Gastrointestinal: Negative for abdominal pain, dysphagia, hematemesis, hematochezia, melena, nausea and vomiting.  Neurological: Negative for dizziness, focal weakness and light-headedness.    PHYSICAL EXAM: Vitals with BMI 05/06/2020 11/04/2019 10/07/2019  Height '5\' 8"'  '5\' 8"'  '5\' 8"'   Weight 201 lbs 200 lbs 10 oz 202 lbs  BMI 30.57 16.10 96.04  Systolic 540 981 191  Diastolic 93 96 72  Pulse 59 61 65   CONSTITUTIONAL: Well-developed and well-nourished. No acute distress.  SKIN: Skin is warm and dry. No rash noted. No cyanosis. No pallor. No jaundice HEAD: Normocephalic and atraumatic.  EYES: No scleral icterus MOUTH/THROAT: Moist oral membranes.  NECK: No JVD present. No thyromegaly noted. No carotid bruits  LYMPHATIC: No visible cervical adenopathy.  CHEST Normal respiratory effort. No intercostal retractions  LUNGS: Clear to auscultation bilaterally.  No stridor. No wheezes. No rales.  CARDIOVASCULAR: Regular rate and rhythm, positive S1-S2, no murmurs rubs or gallops appreciated. ABDOMINAL: Soft, nontender, nondistended, positive bowel sounds were quadrants.  No apparent ascites.  EXTREMITIES: No peripheral edema  HEMATOLOGIC: No significant bruising NEUROLOGIC: Oriented to person, place,  and time. Nonfocal. Normal muscle tone.  PSYCHIATRIC: Normal mood and affect. Normal behavior. Cooperative  CARDIAC DATABASE: EKG: 05/06/2020: Sinus bradycardia, 57 bpm, normal axis, T wave flattening and TWI in the inferolateral leads.  No significant change compared to prior ECG dated 10/07/2019.    Echocardiogram: 01/23/2019: LVEF 58%, moderate concentric LVH, grade 1 diastolic impairment, mild MR, mild TR, no evidence of pulmonary hypertension.  Stress Testing:  Lexiscan/modified Bruce Tetrofosmin stress test 10/18/2019: Normal myocardial perfusion imaging. Stress LVEF 63%. Low risk study.   Heart Catheterization: 03/12/19: Normal LVEF. Normal LVEDP. Ost LM lesion is 15% stenosed.  [place] RCA and RI and RCA. Mid LAD lesion is 95% stenosedS/P [place] 3.50X10 followed by [place] 3.5X18. Maximum pressure: 12 atm. 95% to 0%.   LABORATORY DATA: CBC Latest Ref Rng & Units 03/01/2019  WBC 3.4 - 10.8 x10E3/uL 5.5  Hemoglobin 13.0 - 17.7 g/dL 13.5  Hematocrit 37.5 - 51.0 % 42.2  Platelets 150 - 450 x10E3/uL 131(L)    CMP Latest Ref Rng & Units 11/11/2019 06/13/2019 03/01/2019  Glucose 65 - 99 mg/dL 113(H) 108(H) 117(H)  BUN 8 - 27 mg/dL '15 18 19  ' Creatinine 0.76 - 1.27 mg/dL 1.38(H) 1.37(H) 1.41(H)  Sodium 134 - 144 mmol/L 141 142  142  Potassium 3.5 - 5.2 mmol/L 4.3 4.7 4.5  Chloride 96 - 106 mmol/L 104 104 106  CO2 20 - 29 mmol/L '22 25 23  ' Calcium 8.6 - 10.2 mg/dL 9.8 9.6 9.6  Total Protein 6.0 - 8.5 g/dL - 7.2 -  Total Bilirubin 0.0 - 1.2 mg/dL - 0.4 -  Alkaline Phos 39 - 117 IU/L - 83 -  AST 0 - 40 IU/L - 20 -  ALT 0 - 44 IU/L - 30 -    Lipid Panel     Component Value Date/Time   CHOL 118 06/13/2019 0900   TRIG 58 06/13/2019 0900   HDL 46 06/13/2019 0900   CHOLHDL 2.6 06/13/2019 0900   LDLCALC 59 06/13/2019 0900   LABVLDL 13 06/13/2019 0900    No results found for: HGBA1C No components found for: NTPROBNP No results found for: TSH  Cardiac Panel  (last 3 results) No results for input(s): CKTOTAL, CKMB, TROPONINIHS, RELINDX in the last 72 hours.  FINAL MEDICATION LIST END OF ENCOUNTER: No orders of the defined types were placed in this encounter.   Medications Discontinued During This Encounter  Medication Reason   losartan-hydrochlorothiazide (HYZAAR) 100-25 MG tablet Change in therapy   timolol (TIMOPTIC) 0.5 % ophthalmic solution Change in therapy   latanoprost (XALATAN) 0.005 % ophthalmic solution Change in therapy     Current Outpatient Medications:    aspirin EC 81 MG tablet, Take 81 mg by mouth daily. , Disp: , Rfl:    brimonidine-timolol (COMBIGAN) 0.2-0.5 % ophthalmic solution, Place 1 drop into both eyes every 12 (twelve) hours., Disp: , Rfl:    clopidogrel (PLAVIX) 75 MG tablet, TAKE 1 TABLET BY MOUTH EVERY DAY, Disp: 90 tablet, Rfl: 2   diphenhydrAMINE (BENADRYL) 25 MG tablet, Take 25 mg by mouth daily., Disp: , Rfl:    dutasteride (AVODART) 0.5 MG capsule, Take 0.5 mg by mouth daily. , Disp: , Rfl:    losartan (COZAAR) 100 MG tablet, TAKE 1 TABLET (100 MG TOTAL) BY MOUTH EVERY EVENING., Disp: 90 tablet, Rfl: 1   metoprolol succinate (TOPROL XL) 25 MG 24 hr tablet, Take 1 tablet (25 mg total) by mouth daily., Disp: 30 tablet, Rfl: 2   nitroGLYCERIN (NITROSTAT) 0.4 MG SL tablet, Place 1 tablet (0.4 mg total) under the tongue every 5 (five) minutes as needed for up to 25 days for chest pain., Disp: 25 tablet, Rfl: 3   rosuvastatin (CRESTOR) 20 MG tablet, TAKE 1 TABLET BY MOUTH EVERY DAY, Disp: 90 tablet, Rfl: 1   vitamin C (ASCORBIC ACID) 500 MG tablet, Take 500 mg by mouth daily., Disp: , Rfl:    Vitamin D, Cholecalciferol, 25 MCG (1000 UT) TABS, Take 1,000 Units by mouth daily. , Disp: , Rfl:    hydrochlorothiazide (MICROZIDE) 12.5 MG capsule, TAKE 1 CAPSULE (12.5 MG TOTAL) BY MOUTH IN THE MORNING., Disp: 90 capsule, Rfl: 3  IMPRESSION:    ICD-10-CM   1. Atherosclerosis of native coronary artery of  native heart without angina pectoris  I25.10   2. Status post angioplasty with stent  Z95.820   3. Essential hypertension  I10   4. Dizziness  R42 EKG 12-Lead  5. Mild hyperlipidemia  E78.5      RECOMMENDATIONS: Bradley Parker is a 69 y.o. male whose past medical history and cardiovascular risk factors include: Benign essential hypertension, established coronary artery disease status post angioplasty and stent, preglaucoma, vitamin D deficiency, obesity, advanced age.  Established coronary artery disease without angina  pectoris with prior angioplasty and stent:  Patient denies any anginal symptoms.  Reviewed the most recent echocardiogram and stress test results with the patient at today's visit.  Continue dual antiplatelet therapy.  EKG notes normal sinus without underlying injury pattern, no significant change compared to prior EKG.  Patient remains asymptomatic and has good functional capacity for age.  Educated on importance of improving his modifiable cardiovascular risk factors and participating in moderate intensity exercise 30 minutes a day 5 days as tolerated.  Benign essential hypertension:  Medications reconciled.  Home blood pressure is very well controlled.  Office blood pressures within acceptable range.  Low-salt diet recommended.  Patient is asked to call the office if his systolic blood pressures are consistently greater than 130 mmHg.  Dizziness: Resolved  Mixed hyperlipidemia:  Continue statin therapy.    Follow lipids.  Patient denies myalgia or other side effects.  Total time spent: 20 minutes.  Orders Placed This Encounter  Procedures   EKG 12-Lead    --Continue cardiac medications as reconciled in final medication list. --Return in 1 year (on 05/06/2021) for 1 yr Follow up, CAD. Or sooner if needed. --Continue follow-up with your primary care physician regarding the management of your other chronic comorbid conditions.  Patient's  questions and concerns were addressed to his satisfaction. He voices understanding of the instructions provided during this encounter.   This note was created using a voice recognition software as a result there may be grammatical errors inadvertently enclosed that do not reflect the nature of this encounter. Every attempt is made to correct such errors.  Rex Kras, [place], [facility]  Pager: [phone] Office: [phone]

## 2020-06-29 DIAGNOSIS — D696 Thrombocytopenia, unspecified: Secondary | ICD-10-CM | POA: Diagnosis not present

## 2020-06-29 DIAGNOSIS — I10 Essential (primary) hypertension: Secondary | ICD-10-CM | POA: Diagnosis not present

## 2020-06-29 DIAGNOSIS — E7800 Pure hypercholesterolemia, unspecified: Secondary | ICD-10-CM | POA: Diagnosis not present

## 2020-06-29 DIAGNOSIS — H409 Unspecified glaucoma: Secondary | ICD-10-CM | POA: Diagnosis not present

## 2020-06-29 DIAGNOSIS — R7989 Other specified abnormal findings of blood chemistry: Secondary | ICD-10-CM | POA: Diagnosis not present

## 2020-06-29 DIAGNOSIS — E559 Vitamin D deficiency, unspecified: Secondary | ICD-10-CM | POA: Diagnosis not present

## 2020-06-29 DIAGNOSIS — I2510 Atherosclerotic heart disease of native coronary artery without angina pectoris: Secondary | ICD-10-CM | POA: Diagnosis not present

## 2020-06-29 DIAGNOSIS — Z955 Presence of coronary angioplasty implant and graft: Secondary | ICD-10-CM | POA: Diagnosis not present

## 2020-07-01 ENCOUNTER — Other Ambulatory Visit: Payer: Self-pay | Admitting: Cardiology

## 2020-07-27 DIAGNOSIS — H409 Unspecified glaucoma: Secondary | ICD-10-CM | POA: Diagnosis not present

## 2020-07-27 DIAGNOSIS — I10 Essential (primary) hypertension: Secondary | ICD-10-CM | POA: Diagnosis not present

## 2020-07-27 DIAGNOSIS — I2510 Atherosclerotic heart disease of native coronary artery without angina pectoris: Secondary | ICD-10-CM | POA: Diagnosis not present

## 2020-07-27 DIAGNOSIS — E7800 Pure hypercholesterolemia, unspecified: Secondary | ICD-10-CM | POA: Diagnosis not present

## 2020-08-29 ENCOUNTER — Other Ambulatory Visit: Payer: Self-pay | Admitting: Cardiology

## 2020-09-09 ENCOUNTER — Other Ambulatory Visit: Payer: Medicare Other

## 2020-09-14 ENCOUNTER — Other Ambulatory Visit: Payer: Medicare Other

## 2020-09-16 DIAGNOSIS — I10 Essential (primary) hypertension: Secondary | ICD-10-CM | POA: Diagnosis not present

## 2020-09-16 DIAGNOSIS — I2510 Atherosclerotic heart disease of native coronary artery without angina pectoris: Secondary | ICD-10-CM | POA: Diagnosis not present

## 2020-09-16 DIAGNOSIS — E7800 Pure hypercholesterolemia, unspecified: Secondary | ICD-10-CM | POA: Diagnosis not present

## 2020-09-16 DIAGNOSIS — H409 Unspecified glaucoma: Secondary | ICD-10-CM | POA: Diagnosis not present

## 2020-09-24 ENCOUNTER — Other Ambulatory Visit: Payer: Self-pay | Admitting: Cardiology

## 2020-09-24 DIAGNOSIS — H26493 Other secondary cataract, bilateral: Secondary | ICD-10-CM | POA: Diagnosis not present

## 2020-09-24 DIAGNOSIS — H401131 Primary open-angle glaucoma, bilateral, mild stage: Secondary | ICD-10-CM | POA: Diagnosis not present

## 2020-09-24 DIAGNOSIS — R42 Dizziness and giddiness: Secondary | ICD-10-CM

## 2020-09-24 DIAGNOSIS — H5213 Myopia, bilateral: Secondary | ICD-10-CM | POA: Diagnosis not present

## 2020-09-24 DIAGNOSIS — I10 Essential (primary) hypertension: Secondary | ICD-10-CM

## 2020-09-24 DIAGNOSIS — Z961 Presence of intraocular lens: Secondary | ICD-10-CM | POA: Diagnosis not present

## 2020-09-28 ENCOUNTER — Other Ambulatory Visit: Payer: Self-pay | Admitting: Cardiology

## 2020-09-29 DIAGNOSIS — H409 Unspecified glaucoma: Secondary | ICD-10-CM | POA: Diagnosis not present

## 2020-09-29 DIAGNOSIS — I10 Essential (primary) hypertension: Secondary | ICD-10-CM | POA: Diagnosis not present

## 2020-09-29 DIAGNOSIS — E7800 Pure hypercholesterolemia, unspecified: Secondary | ICD-10-CM | POA: Diagnosis not present

## 2020-09-29 DIAGNOSIS — I2510 Atherosclerotic heart disease of native coronary artery without angina pectoris: Secondary | ICD-10-CM | POA: Diagnosis not present

## 2020-10-12 DIAGNOSIS — H43811 Vitreous degeneration, right eye: Secondary | ICD-10-CM | POA: Diagnosis not present

## 2020-10-13 ENCOUNTER — Other Ambulatory Visit: Payer: Self-pay | Admitting: Cardiology

## 2020-10-28 DIAGNOSIS — H43811 Vitreous degeneration, right eye: Secondary | ICD-10-CM | POA: Diagnosis not present

## 2020-11-18 DIAGNOSIS — I2510 Atherosclerotic heart disease of native coronary artery without angina pectoris: Secondary | ICD-10-CM | POA: Diagnosis not present

## 2020-11-18 DIAGNOSIS — I10 Essential (primary) hypertension: Secondary | ICD-10-CM | POA: Diagnosis not present

## 2020-11-18 DIAGNOSIS — H409 Unspecified glaucoma: Secondary | ICD-10-CM | POA: Diagnosis not present

## 2020-11-18 DIAGNOSIS — E7800 Pure hypercholesterolemia, unspecified: Secondary | ICD-10-CM | POA: Diagnosis not present

## 2021-01-01 DIAGNOSIS — Z95820 Peripheral vascular angioplasty status with implants and grafts: Secondary | ICD-10-CM | POA: Diagnosis not present

## 2021-01-01 DIAGNOSIS — Z0000 Encounter for general adult medical examination without abnormal findings: Secondary | ICD-10-CM | POA: Diagnosis not present

## 2021-01-01 DIAGNOSIS — I10 Essential (primary) hypertension: Secondary | ICD-10-CM | POA: Diagnosis not present

## 2021-01-01 DIAGNOSIS — E559 Vitamin D deficiency, unspecified: Secondary | ICD-10-CM | POA: Diagnosis not present

## 2021-01-01 DIAGNOSIS — R7989 Other specified abnormal findings of blood chemistry: Secondary | ICD-10-CM | POA: Diagnosis not present

## 2021-01-01 DIAGNOSIS — Z23 Encounter for immunization: Secondary | ICD-10-CM | POA: Diagnosis not present

## 2021-01-01 DIAGNOSIS — E7800 Pure hypercholesterolemia, unspecified: Secondary | ICD-10-CM | POA: Diagnosis not present

## 2021-01-01 DIAGNOSIS — I2510 Atherosclerotic heart disease of native coronary artery without angina pectoris: Secondary | ICD-10-CM | POA: Diagnosis not present

## 2021-01-21 ENCOUNTER — Other Ambulatory Visit: Payer: Self-pay | Admitting: Cardiology

## 2021-01-21 DIAGNOSIS — I10 Essential (primary) hypertension: Secondary | ICD-10-CM

## 2021-01-27 ENCOUNTER — Other Ambulatory Visit: Payer: Self-pay

## 2021-01-27 ENCOUNTER — Encounter: Payer: Self-pay | Admitting: Cardiology

## 2021-01-27 ENCOUNTER — Ambulatory Visit: Payer: Medicare Other | Admitting: Cardiology

## 2021-01-27 VITALS — BP 112/79 | HR 72 | Temp 97.8°F | Resp 16 | Ht 68.0 in | Wt 193.6 lb

## 2021-01-27 DIAGNOSIS — Z95820 Peripheral vascular angioplasty status with implants and grafts: Secondary | ICD-10-CM | POA: Diagnosis not present

## 2021-01-27 DIAGNOSIS — E785 Hyperlipidemia, unspecified: Secondary | ICD-10-CM | POA: Diagnosis not present

## 2021-01-27 DIAGNOSIS — I10 Essential (primary) hypertension: Secondary | ICD-10-CM | POA: Diagnosis not present

## 2021-01-27 DIAGNOSIS — I209 Angina pectoris, unspecified: Secondary | ICD-10-CM

## 2021-01-27 DIAGNOSIS — I25118 Atherosclerotic heart disease of native coronary artery with other forms of angina pectoris: Secondary | ICD-10-CM | POA: Diagnosis not present

## 2021-01-27 DIAGNOSIS — R42 Dizziness and giddiness: Secondary | ICD-10-CM

## 2021-01-27 DIAGNOSIS — I2510 Atherosclerotic heart disease of native coronary artery without angina pectoris: Secondary | ICD-10-CM

## 2021-01-27 NOTE — Progress Notes (Signed)
Bradley Parker Date of Birth: [birth year] MRN: [id number] Primary Care Provider:Wong, Edwyna Shell, MD   Date: 01/27/21 Last Office Visit: 05/17/2020  Chief Complaint  Patient presents with   Atherosclerosis of native coronary artery of native heart w   Chest Pain   Follow-up    HPI  Bradley Parker is a 70 y.o.  male who presents to the office with a chief complaint of " 79-monthfollow-up for CAD management." Patient's past medical history and cardiovascular risk factors include: Benign essential hypertension, established coronary artery disease status post angioplasty and stent, preglaucoma, vitamin D deficiency, obesity, advanced age.  Former patient of AMiquel Dunn MSN, APRN, FNP-C and re-established care with myself back in April 2021.     Patient was originally scheduled for a 1 year follow-up and or present sooner given symptoms of chest pain.  In September 2022 he underwent left heart catheterization and successfully underwent angioplasty/stenting of the mid LAD and since then has been doing well and at the last office visit was recommended to follow-up annually.  However recently since July 2022 patient has been experiencing chest discomfort/pain 2 or 3 times per week.  The last episode was a week ago.  He describes the discomfort as substernally located, ache/pressure-like sensation, occurring intermittently, more noticeable with activity, eases off with cessation of activity or exertional related symptoms.  Patient states that his symptoms are similar to his event back in 2020 but not as intense.  He has not required the use of sublingual nitroglycerin tablets.  And no recent hospitalizations or urgent care visits for evaluation of his chest pain.  He has been implemented lifestyle changes over the last 6 months and has lost 8 pounds.  ALLERGIES: No Known Allergies   MEDICATION LIST PRIOR TO VISIT: Current Outpatient Medications on File Prior to Visit  Medication  Sig Dispense Refill   aspirin EC 81 MG tablet Take 81 mg by mouth daily.      brimonidine-timolol (COMBIGAN) 0.2-0.5 % ophthalmic solution Place 1 drop into both eyes every 12 (twelve) hours.     cetirizine (ZYRTEC) 10 MG tablet Take 10 mg by mouth daily as needed for allergies.     clopidogrel (PLAVIX) 75 MG tablet TAKE 1 TABLET BY MOUTH EVERY DAY 90 tablet 2   dutasteride (AVODART) 0.5 MG capsule Take 0.5 mg by mouth daily.      hydrochlorothiazide (MICROZIDE) 12.5 MG capsule TAKE 1 CAPSULE (12.5 MG TOTAL) BY MOUTH IN THE MORNING. 90 capsule 3   losartan (COZAAR) 100 MG tablet TAKE 1 TABLET (100 MG TOTAL) BY MOUTH EVERY EVENING. 90 tablet 1   metoprolol succinate (TOPROL-XL) 25 MG 24 hr tablet TAKE 1 TABLET BY MOUTH EVERY DAY 90 tablet 1   nitroGLYCERIN (NITROSTAT) 0.4 MG SL tablet Place 1 tablet (0.4 mg total) under the tongue every 5 (five) minutes as needed for up to 25 days for chest pain. 25 tablet 3   rosuvastatin (CRESTOR) 20 MG tablet TAKE 1 TABLET BY MOUTH EVERY DAY 90 tablet 1   vitamin C (ASCORBIC ACID) 500 MG tablet Take 500 mg by mouth daily.     Vitamin D, Cholecalciferol, 25 MCG (1000 UT) TABS Take 1,000 Units by mouth daily.      No current facility-administered medications on file prior to visit.    PAST MEDICAL HISTORY: Past Medical History:  Diagnosis Date   Coronary artery disease    HTN (hypertension) 01/04/2019   Hyperlipidemia     PAST SURGICAL HISTORY:  Past Surgical History:  Procedure Laterality Date   CARDIAC CATHETERIZATION     CATARACT EXTRACTION Left 01/29/2019   CORONARY ANGIOPLASTY WITH STENT PLACEMENT     CORONARY STENT INTERVENTION N/A 03/12/2019   Procedure: CORONARY STENT INTERVENTION;  Surgeon: Adrian Prows, MD;  Location: [facility];  Service: Cardiovascular;  Laterality: N/A;   HERNIA REPAIR     LEFT HEART CATH AND CORONARY ANGIOGRAPHY N/A 03/12/2019   Procedure: LEFT HEART CATH AND CORONARY ANGIOGRAPHY;  Surgeon: Adrian Prows, MD;   Location: [facility];  Service: Cardiovascular;  Laterality: N/A;    FAMILY HISTORY: The patient's family history includes Diabetes in his mother; Hypertension in his father and mother.   SOCIAL HISTORY:  The patient  reports that he has never smoked. He has never used smokeless tobacco. He reports that he does not drink alcohol and does not use drugs.  Review of Systems  Constitutional: Negative for chills and fever.  HENT:  Negative for ear discharge, ear pain and nosebleeds.   Eyes:  Negative for blurred vision and discharge.  Cardiovascular:  Positive for chest pain. Negative for claudication, dyspnea on exertion, leg swelling, near-syncope, orthopnea, palpitations, paroxysmal nocturnal dyspnea and syncope.  Respiratory:  Negative for cough and shortness of breath.   Endocrine: Negative for polydipsia, polyphagia and polyuria.  Hematologic/Lymphatic: Negative for bleeding problem.  Skin:  Negative for flushing and nail changes.  Musculoskeletal:  Negative for muscle cramps, muscle weakness and myalgias.  Gastrointestinal:  Negative for abdominal pain, dysphagia, hematemesis, hematochezia, melena, nausea and vomiting.  Neurological:  Negative for dizziness, focal weakness and light-headedness.   PHYSICAL EXAM: Vitals with BMI 01/27/2021 05/06/2020 11/04/2019  Height '5\' 8"'  '5\' 8"'  '5\' 8"'   Weight 193 lbs 10 oz 201 lbs 200 lbs 10 oz  BMI 29.44 01.75 10.25  Systolic 852 778 242  Diastolic 79 93 96  Pulse 72 59 61   CONSTITUTIONAL: Well-developed and well-nourished. No acute distress.  SKIN: Skin is warm and dry. No rash noted. No cyanosis. No pallor. No jaundice HEAD: Normocephalic and atraumatic.  EYES: No scleral icterus MOUTH/THROAT: Moist oral membranes.  NECK: No JVD present. No thyromegaly noted. No carotid bruits  LYMPHATIC: No visible cervical adenopathy.  CHEST Normal respiratory effort. No intercostal retractions  LUNGS: Clear to auscultation bilaterally.  No  stridor. No wheezes. No rales.  CARDIOVASCULAR: Regular rate and rhythm, positive S1-S2, no murmurs rubs or gallops appreciated. ABDOMINAL: Soft, nontender, nondistended, positive bowel sounds were quadrants.  No apparent ascites.  EXTREMITIES: No peripheral edema  HEMATOLOGIC: No significant bruising NEUROLOGIC: Oriented to person, place, and time. Nonfocal. Normal muscle tone.  PSYCHIATRIC: Normal mood and affect. Normal behavior. Cooperative  CARDIAC DATABASE: EKG: 01/27/2021: Normal sinus rhythm, 66 bpm, nonspecific T wave abnormality.  Echocardiogram: 01/23/2019: LVEF 58%, moderate concentric LVH, grade 1 diastolic impairment, mild MR, mild TR, no evidence of pulmonary hypertension.  Stress Testing:  Lexiscan/modified Bruce Tetrofosmin stress test 10/18/2019: Normal myocardial perfusion imaging. Stress LVEF 63%. Low risk study.   Heart Catheterization: 03/12/19: Normal LVEF. Normal LVEDP. Ost LM lesion is 15% stenosed.  Noraml RCA and RI and RCA. Mid LAD lesion is 95% stenosedS/P BALLOON WOLVERINE 3.50X10 followed by Freeman 3.5X18. Maximum pressure: 12 atm. 95% to 0%.   LABORATORY DATA: CBC Latest Ref Rng & Units 03/01/2019  WBC 3.4 - 10.8 x10E3/uL 5.5  Hemoglobin 13.0 - 17.7 g/dL 13.5  Hematocrit 37.5 - 51.0 % 42.2  Platelets 150 - 450 x10E3/uL 131(L)  CMP Latest Ref Rng & Units 11/11/2019 06/13/2019 03/01/2019  Glucose 65 - 99 mg/dL 113(H) 108(H) 117(H)  BUN 8 - 27 mg/dL '15 18 19  ' Creatinine 0.76 - 1.27 mg/dL 1.38(H) 1.37(H) 1.41(H)  Sodium 134 - 144 mmol/L 141 142 142  Potassium 3.5 - 5.2 mmol/L 4.3 4.7 4.5  Chloride 96 - 106 mmol/L 104 104 106  CO2 20 - 29 mmol/L '22 25 23  ' Calcium 8.6 - 10.2 mg/dL 9.8 9.6 9.6  Total Protein 6.0 - 8.5 g/dL - 7.2 -  Total Bilirubin 0.0 - 1.2 mg/dL - 0.4 -  Alkaline Phos 39 - 117 IU/L - 83 -  AST 0 - 40 IU/L - 20 -  ALT 0 - 44 IU/L - 30 -    Lipid Panel     Component Value Date/Time   CHOL 118 06/13/2019 0900   TRIG 58  06/13/2019 0900   HDL 46 06/13/2019 0900   CHOLHDL 2.6 06/13/2019 0900   LDLCALC 59 06/13/2019 0900   LABVLDL 13 06/13/2019 0900    No results found for: HGBA1C No components found for: NTPROBNP No results found for: TSH  Cardiac Panel (last 3 results) No results for input(s): CKTOTAL, CKMB, TROPONINIHS, RELINDX in the last 72 hours.  FINAL MEDICATION LIST END OF ENCOUNTER: No orders of the defined types were placed in this encounter.   Medications Discontinued During This Encounter  Medication Reason   diphenhydrAMINE (BENADRYL) 25 MG tablet Error     Current Outpatient Medications:    aspirin EC 81 MG tablet, Take 81 mg by mouth daily. , Disp: , Rfl:    brimonidine-timolol (COMBIGAN) 0.2-0.5 % ophthalmic solution, Place 1 drop into both eyes every 12 (twelve) hours., Disp: , Rfl:    cetirizine (ZYRTEC) 10 MG tablet, Take 10 mg by mouth daily as needed for allergies., Disp: , Rfl:    clopidogrel (PLAVIX) 75 MG tablet, TAKE 1 TABLET BY MOUTH EVERY DAY, Disp: 90 tablet, Rfl: 2   dutasteride (AVODART) 0.5 MG capsule, Take 0.5 mg by mouth daily. , Disp: , Rfl:    hydrochlorothiazide (MICROZIDE) 12.5 MG capsule, TAKE 1 CAPSULE (12.5 MG TOTAL) BY MOUTH IN THE MORNING., Disp: 90 capsule, Rfl: 3   losartan (COZAAR) 100 MG tablet, TAKE 1 TABLET (100 MG TOTAL) BY MOUTH EVERY EVENING., Disp: 90 tablet, Rfl: 1   metoprolol succinate (TOPROL-XL) 25 MG 24 hr tablet, TAKE 1 TABLET BY MOUTH EVERY DAY, Disp: 90 tablet, Rfl: 1   nitroGLYCERIN (NITROSTAT) 0.4 MG SL tablet, Place 1 tablet (0.4 mg total) under the tongue every 5 (five) minutes as needed for up to 25 days for chest pain., Disp: 25 tablet, Rfl: 3   rosuvastatin (CRESTOR) 20 MG tablet, TAKE 1 TABLET BY MOUTH EVERY DAY, Disp: 90 tablet, Rfl: 1   vitamin C (ASCORBIC ACID) 500 MG tablet, Take 500 mg by mouth daily., Disp: , Rfl:    Vitamin D, Cholecalciferol, 25 MCG (1000 UT) TABS, Take 1,000 Units by mouth daily. , Disp: , Rfl:    IMPRESSION:    ICD-10-CM   1. Angina pectoris ([place])  I20.9 PCV MYOCARDIAL PERFUSION WO LEXISCAN    2. Atherosclerosis of native coronary artery of native heart with other form of angina pectoris ([place])  I25.118 EKG 12-Lead    PCV MYOCARDIAL PERFUSION WO LEXISCAN    3. Status post angioplasty with stent  Z95.820 PCV MYOCARDIAL PERFUSION WO LEXISCAN    4. Essential hypertension  I10     5. Mild  hyperlipidemia  E78.5        RECOMMENDATIONS: Bradley Parker is a 70 y.o. male whose past medical history and cardiovascular risk factors include: Benign essential hypertension, established coronary artery disease status post angioplasty and stent, preglaucoma, vitamin D deficiency, obesity, advanced age.  Established coronary artery disease with angina pectoris with prior angioplasty and stent: Patient has been having symptoms of precordial pain suggestive of cardiac etiology but less intense compared to his event back in 2020.   EKG is nonischemic.   Patient is educated on using sublingual nitroglycerin tablets if the symptoms resurface.  And if they increase in intensity, frequency, duration or are similar to his event back in 2020 he is asked to go to the closest ER via EMS.   Does not have any symptoms during today's office visit and the last episode was a week ago.   Recommend exercise nuclear stress test to evaluate for functional status and reversible ischemia.   Medications reconciled.    Benign essential hypertension: Medications reconciled. Home and office blood pressure is very well controlled. Low-salt diet recommended. Patient is asked to call the office if his systolic blood pressures are consistently greater than 130 mmHg.  Mixed hyperlipidemia: Continue statin therapy.   Follow lipids. Patient denies myalgia or other side effects.  Total time spent: 35 minutes.  Orders Placed This Encounter  Procedures   PCV MYOCARDIAL PERFUSION WO LEXISCAN   EKG 12-Lead     --Continue cardiac medications as reconciled in final medication list. --Return in about 3 weeks (around 02/17/2021) for Follow up, Chest pain, Review test results. Or sooner if needed. --Continue follow-up with your primary care physician regarding the management of your other chronic comorbid conditions.  Patient's questions and concerns were addressed to his satisfaction. He voices understanding of the instructions provided during this encounter.   This note was created using a voice recognition software as a result there may be grammatical errors inadvertently enclosed that do not reflect the nature of this encounter. Every attempt is made to correct such errors.  Rex Kras, [place], [facility]  Pager: [phone] Office: [phone]

## 2021-02-15 ENCOUNTER — Ambulatory Visit: Payer: Medicare Other

## 2021-02-15 ENCOUNTER — Other Ambulatory Visit: Payer: Self-pay

## 2021-02-15 DIAGNOSIS — Z95820 Peripheral vascular angioplasty status with implants and grafts: Secondary | ICD-10-CM

## 2021-02-15 DIAGNOSIS — I209 Angina pectoris, unspecified: Secondary | ICD-10-CM | POA: Diagnosis not present

## 2021-02-15 DIAGNOSIS — I25118 Atherosclerotic heart disease of native coronary artery with other forms of angina pectoris: Secondary | ICD-10-CM

## 2021-02-25 ENCOUNTER — Other Ambulatory Visit: Payer: Self-pay | Admitting: Cardiology

## 2021-03-02 ENCOUNTER — Encounter: Payer: Self-pay | Admitting: Cardiology

## 2021-03-02 ENCOUNTER — Other Ambulatory Visit: Payer: Self-pay

## 2021-03-02 ENCOUNTER — Ambulatory Visit: Payer: Medicare Other | Admitting: Cardiology

## 2021-03-02 VITALS — BP 110/78 | HR 63 | Temp 97.2°F | Resp 16 | Ht 68.0 in | Wt 195.0 lb

## 2021-03-02 DIAGNOSIS — E785 Hyperlipidemia, unspecified: Secondary | ICD-10-CM | POA: Diagnosis not present

## 2021-03-02 DIAGNOSIS — I10 Essential (primary) hypertension: Secondary | ICD-10-CM | POA: Diagnosis not present

## 2021-03-02 DIAGNOSIS — I2510 Atherosclerotic heart disease of native coronary artery without angina pectoris: Secondary | ICD-10-CM

## 2021-03-02 DIAGNOSIS — Z95820 Peripheral vascular angioplasty status with implants and grafts: Secondary | ICD-10-CM | POA: Diagnosis not present

## 2021-03-02 MED ORDER — NITROGLYCERIN 0.4 MG SL SUBL: 0.4000 mg | SUBLINGUAL_TABLET | SUBLINGUAL | 3 refills | Status: DC | PRN

## 2021-03-02 NOTE — Progress Notes (Signed)
Bradley Parker Date of Birth: [birth year] MRN: [id number] Primary Care Provider:Wong, Edwyna Shell, MD   Date: 03/02/21 Last Office Visit: 01/27/2021  Chief Complaint  Patient presents with   Chest Pain   Follow-up   Results    Stress test    HPI  Bradley Parker is a 70 y.o.  male who presents to the office with a chief complaint of " reevaluation of chest pain and review test results." Patient's past medical history and cardiovascular risk factors include: Benign essential hypertension, established coronary artery disease status post angioplasty and stent, preglaucoma, vitamin D deficiency, obesity, advanced age.  Former patient of Miquel Dunn, MSN, APRN, FNP-C and re-established care with myself back in April 2021.     In August 2022 patient presents for office visit prior to his 1 year follow-up appointment complaining of chest discomfort.  His discomfort was located substernally, ache/pressure-like sensation, occurring intermittently, noticeable with activity, and improving with resting.  Patient has a history of angioplasty/stenting to the mid LAD as of September 2020.  Patient stated that his symptoms in August 2022 were very similar to his event back in 2020 but less intense.  The shared decision was to proceed with stress testing for further evaluation and management.  He now presents for follow-up.  Since last office visit patient denies any chest pain or shortness of breath at rest or with effort related activities.  He has not required the use of sublingual nitroglycerin tablets.  No visits to urgent care or hospitalizations for cardiovascular symptoms.  Patient recently had his yearly physical with his PCP and had labs done.  Underwent stress test since last office encounter results reviewed with him in great detail including the images.  ALLERGIES: No Known Allergies   MEDICATION LIST PRIOR TO VISIT: Current Outpatient Medications on File Prior to Visit   Medication Sig Dispense Refill   aspirin EC 81 MG tablet Take 81 mg by mouth daily.      brimonidine-timolol (COMBIGAN) 0.2-0.5 % ophthalmic solution Place 1 drop into both eyes every 12 (twelve) hours.     cetirizine (ZYRTEC) 10 MG tablet Take 10 mg by mouth daily as needed for allergies.     clopidogrel (PLAVIX) 75 MG tablet TAKE 1 TABLET BY MOUTH EVERY DAY 90 tablet 2   dutasteride (AVODART) 0.5 MG capsule Take 0.5 mg by mouth daily.      hydrochlorothiazide (MICROZIDE) 12.5 MG capsule TAKE 1 CAPSULE (12.5 MG TOTAL) BY MOUTH IN THE MORNING. 90 capsule 3   losartan (COZAAR) 100 MG tablet TAKE 1 TABLET (100 MG TOTAL) BY MOUTH EVERY EVENING. 90 tablet 1   metoprolol succinate (TOPROL-XL) 25 MG 24 hr tablet TAKE 1 TABLET BY MOUTH EVERY DAY 90 tablet 1   rosuvastatin (CRESTOR) 20 MG tablet TAKE 1 TABLET BY MOUTH EVERY DAY 90 tablet 1   vitamin C (ASCORBIC ACID) 500 MG tablet Take 500 mg by mouth daily.     Vitamin D, Cholecalciferol, 25 MCG (1000 UT) TABS Take 1,000 Units by mouth daily.      No current facility-administered medications on file prior to visit.    PAST MEDICAL HISTORY: Past Medical History:  Diagnosis Date   Coronary artery disease    HTN (hypertension) 01/04/2019   Hyperlipidemia     PAST SURGICAL HISTORY: Past Surgical History:  Procedure Laterality Date   CARDIAC CATHETERIZATION     CATARACT EXTRACTION Left 01/29/2019   CORONARY ANGIOPLASTY WITH STENT PLACEMENT     CORONARY STENT  INTERVENTION N/A 03/12/2019   Procedure: CORONARY STENT INTERVENTION;  Surgeon: Adrian Prows, MD;  Location: [facility];  Service: Cardiovascular;  Laterality: N/A;   HERNIA REPAIR     LEFT HEART CATH AND CORONARY ANGIOGRAPHY N/A 03/12/2019   Procedure: LEFT HEART CATH AND CORONARY ANGIOGRAPHY;  Surgeon: Adrian Prows, MD;  Location: [facility];  Service: Cardiovascular;  Laterality: N/A;    FAMILY HISTORY: The patient's family history includes Diabetes in his mother;  Hypertension in his father and mother.   SOCIAL HISTORY:  The patient  reports that he has never smoked. He has never used smokeless tobacco. He reports that he does not drink alcohol and does not use drugs.  Review of Systems  Constitutional: Negative for chills and fever.  HENT:  Negative for ear discharge, ear pain and nosebleeds.   Eyes:  Negative for blurred vision and discharge.  Cardiovascular:  Positive for chest pain. Negative for claudication, dyspnea on exertion, leg swelling, near-syncope, orthopnea, palpitations, paroxysmal nocturnal dyspnea and syncope.  Respiratory:  Negative for cough and shortness of breath.   Endocrine: Negative for polydipsia, polyphagia and polyuria.  Hematologic/Lymphatic: Negative for bleeding problem.  Skin:  Negative for flushing and nail changes.  Musculoskeletal:  Negative for muscle cramps, muscle weakness and myalgias.  Gastrointestinal:  Negative for abdominal pain, dysphagia, hematemesis, hematochezia, melena, nausea and vomiting.  Neurological:  Negative for dizziness, focal weakness and light-headedness.   PHYSICAL EXAM: Vitals with BMI 03/02/2021 01/27/2021 05/06/2020  Height '5\' 8"'  '5\' 8"'  '5\' 8"'   Weight 195 lbs 193 lbs 10 oz 201 lbs  BMI 29.66 75.64 33.29  Systolic 518 841 660  Diastolic 78 79 93  Pulse 63 72 59   CONSTITUTIONAL: Well-developed and well-nourished. No acute distress.  SKIN: Skin is warm and dry. No rash noted. No cyanosis. No pallor. No jaundice HEAD: Normocephalic and atraumatic.  EYES: No scleral icterus MOUTH/THROAT: Moist oral membranes.  NECK: No JVD present. No thyromegaly noted. No carotid bruits  LYMPHATIC: No visible cervical adenopathy.  CHEST Normal respiratory effort. No intercostal retractions  LUNGS: Clear to auscultation bilaterally.  No stridor. No wheezes. No rales.  CARDIOVASCULAR: Regular rate and rhythm, positive S1-S2, no murmurs rubs or gallops appreciated. ABDOMINAL: Soft, nontender, nondistended,  positive bowel sounds were quadrants.  No apparent ascites.  EXTREMITIES: No peripheral edema  HEMATOLOGIC: No significant bruising NEUROLOGIC: Oriented to person, place, and time. Nonfocal. Normal muscle tone.  PSYCHIATRIC: Normal mood and affect. Normal behavior. Cooperative  CARDIAC DATABASE: EKG: 01/27/2021: Normal sinus rhythm, 66 bpm, nonspecific T wave abnormality.  Echocardiogram: 01/23/2019: LVEF 58%, moderate concentric LVH, grade 1 diastolic impairment, mild MR, mild TR, no evidence of pulmonary hypertension.  Stress Testing:  Lexiscan/modified Bruce Tetrofosmin stress test 10/18/2019: Normal myocardial perfusion imaging. Stress LVEF 63%. Low risk study.   Exercise nuclear stress test 02/15/2021:  Normal ECG stress. The patient exercised for 5 minutes and 59 seconds of a Bruce protocol, achieving approximately 7.05 METs. Resting EKG demonstrated normal sinus rhythm. Peak EKG revealed no ST-T wave abnormalities. Recovery EKG revealed non-specific T-wave abnormalities. Normal BP response. Exercise capacity is reduced.  Mild soft tissue attenuation in the inferior wall, a small sized apical inferior ischemia cannot be completely excluded. Overall LV systolic function is normal without regional wall motion abnormalities. Stress LV EF: 58%.  No previous exam available for comparison. Low risk.   Heart Catheterization: 03/12/19: Normal LVEF. Normal LVEDP. Ost LM lesion is 15% stenosed.  Noraml RCA and RI  and RCA. Mid LAD lesion is 95% stenosedS/P BALLOON WOLVERINE 3.50X10 followed by [place] 3.5X18. Maximum pressure: 12 atm. 95% to 0%.   LABORATORY DATA: CBC Latest Ref Rng & Units 03/01/2019  WBC 3.4 - 10.8 x10E3/uL 5.5  Hemoglobin 13.0 - 17.7 g/dL 13.5  Hematocrit 37.5 - 51.0 % 42.2  Platelets 150 - 450 x10E3/uL 131(L)    CMP Latest Ref Rng & Units 11/11/2019 06/13/2019 03/01/2019  Glucose 65 - 99 mg/dL 113(H) 108(H) 117(H)  BUN 8 - 27 mg/dL '15 18 19  ' Creatinine 0.76 -  1.27 mg/dL 1.38(H) 1.37(H) 1.41(H)  Sodium 134 - 144 mmol/L 141 142 142  Potassium 3.5 - 5.2 mmol/L 4.3 4.7 4.5  Chloride 96 - 106 mmol/L 104 104 106  CO2 20 - 29 mmol/L '22 25 23  ' Calcium 8.6 - 10.2 mg/dL 9.8 9.6 9.6  Total Protein 6.0 - 8.5 g/dL - 7.2 -  Total Bilirubin 0.0 - 1.2 mg/dL - 0.4 -  Alkaline Phos 39 - 117 IU/L - 83 -  AST 0 - 40 IU/L - 20 -  ALT 0 - 44 IU/L - 30 -    Lipid Panel     Component Value Date/Time   CHOL 118 06/13/2019 0900   TRIG 58 06/13/2019 0900   HDL 46 06/13/2019 0900   CHOLHDL 2.6 06/13/2019 0900   LDLCALC 59 06/13/2019 0900   LABVLDL 13 06/13/2019 0900    No results found for: HGBA1C No components found for: NTPROBNP No results found for: TSH  Cardiac Panel (last 3 results) No results for input(s): CKTOTAL, CKMB, TROPONINIHS, RELINDX in the last 72 hours.  FINAL MEDICATION LIST END OF ENCOUNTER: Meds ordered this encounter  Medications   nitroGLYCERIN (NITROSTAT) 0.4 MG SL tablet    Sig: Place 1 tablet (0.4 mg total) under the tongue every 5 (five) minutes as needed for up to 25 days for chest pain.    Dispense:  25 tablet    Refill:  3     Medications Discontinued During This Encounter  Medication Reason   nitroGLYCERIN (NITROSTAT) 0.4 MG SL tablet Reorder      Current Outpatient Medications:    aspirin EC 81 MG tablet, Take 81 mg by mouth daily. , Disp: , Rfl:    brimonidine-timolol (COMBIGAN) 0.2-0.5 % ophthalmic solution, Place 1 drop into both eyes every 12 (twelve) hours., Disp: , Rfl:    cetirizine (ZYRTEC) 10 MG tablet, Take 10 mg by mouth daily as needed for allergies., Disp: , Rfl:    clopidogrel (PLAVIX) 75 MG tablet, TAKE 1 TABLET BY MOUTH EVERY DAY, Disp: 90 tablet, Rfl: 2   dutasteride (AVODART) 0.5 MG capsule, Take 0.5 mg by mouth daily. , Disp: , Rfl:    hydrochlorothiazide (MICROZIDE) 12.5 MG capsule, TAKE 1 CAPSULE (12.5 MG TOTAL) BY MOUTH IN THE MORNING., Disp: 90 capsule, Rfl: 3   losartan (COZAAR) 100 MG tablet,  TAKE 1 TABLET (100 MG TOTAL) BY MOUTH EVERY EVENING., Disp: 90 tablet, Rfl: 1   metoprolol succinate (TOPROL-XL) 25 MG 24 hr tablet, TAKE 1 TABLET BY MOUTH EVERY DAY, Disp: 90 tablet, Rfl: 1   rosuvastatin (CRESTOR) 20 MG tablet, TAKE 1 TABLET BY MOUTH EVERY DAY, Disp: 90 tablet, Rfl: 1   vitamin C (ASCORBIC ACID) 500 MG tablet, Take 500 mg by mouth daily., Disp: , Rfl:    Vitamin D, Cholecalciferol, 25 MCG (1000 UT) TABS, Take 1,000 Units by mouth daily. , Disp: , Rfl:    nitroGLYCERIN (  NITROSTAT) 0.4 MG SL tablet, Place 1 tablet (0.4 mg total) under the tongue every 5 (five) minutes as needed for up to 25 days for chest pain., Disp: 25 tablet, Rfl: 3  IMPRESSION:    ICD-10-CM   1. Atherosclerosis of native coronary artery of native heart without angina pectoris  I25.10 nitroGLYCERIN (NITROSTAT) 0.4 MG SL tablet    2. Status post angioplasty with stent  Z95.820     3. Essential hypertension  I10     4. Mild hyperlipidemia  E78.5         RECOMMENDATIONS: Bradley Parker is a 70 y.o. male whose past medical history and cardiovascular risk factors include: Benign essential hypertension, established coronary artery disease status post angioplasty and stent, preglaucoma, vitamin D deficiency, obesity, advanced age.  Atherosclerosis of native coronary artery of native heart without angina pectoris Denies chest pain since last office encounter. No use of sublingual nitroglycerin tablets. Reviewed the results of the nuclear stress test with the patient at today's office visit including the images. Also reviewed his left heart catheterization images that were performed in 2020.  His RCA in the LCx are very well patent without any obstructive disease. Patient thinks that his symptoms may be contributory to GERD.  I have asked him to discuss management with his PCP. Given his history of coronary artery disease with prior stenting I have asked him to seek medical attention by coming to the office  or going to the closest ER via EMS if his symptoms were to get worse in intensity, frequency, duration or has typical chest pain. Medications reconciled Sublingual nitroglycerin prescription refilled.  Essential hypertension Office blood pressures are very well controlled. Medications reconciled. Patient is following lifestyle changes and has implemented a low-salt diet. He has lost weight since prior office encounters.  Mild hyperlipidemia Currently on statin therapy. Follow lipids. Denies myalgias. Recently had labs with his PCP.  I will obtain labs from his PCP that were done as a part of his yearly physical.  These labs are not available in epic or Care Everywhere.  I would like to see the patient back in 1 year for follow-up visit or sooner if change in clinical status.  I have asked him to schedule his yearly visit after his annual physical with his PCP and to bring labs with him for reference.  Total time spent: 30 minutes.  No orders of the defined types were placed in this encounter.   --Continue cardiac medications as reconciled in final medication list. --Return in about 1 year (around 03/02/2022) for Follow up, CAD. Or sooner if needed. --Continue follow-up with your primary care physician regarding the management of your other chronic comorbid conditions.  Patient's questions and concerns were addressed to his satisfaction. He voices understanding of the instructions provided during this encounter.   This note was created using a voice recognition software as a result there may be grammatical errors inadvertently enclosed that do not reflect the nature of this encounter. Every attempt is made to correct such errors.  Rex Kras, [place], [facility]  Pager: [phone] Office: [phone]

## 2021-03-18 ENCOUNTER — Other Ambulatory Visit: Payer: Self-pay | Admitting: Cardiology

## 2021-03-18 DIAGNOSIS — R42 Dizziness and giddiness: Secondary | ICD-10-CM

## 2021-03-18 DIAGNOSIS — I10 Essential (primary) hypertension: Secondary | ICD-10-CM

## 2021-03-29 ENCOUNTER — Other Ambulatory Visit: Payer: Self-pay | Admitting: Cardiology

## 2021-03-31 DIAGNOSIS — I10 Essential (primary) hypertension: Secondary | ICD-10-CM | POA: Diagnosis not present

## 2021-03-31 DIAGNOSIS — N1831 Chronic kidney disease, stage 3a: Secondary | ICD-10-CM | POA: Diagnosis not present

## 2021-03-31 DIAGNOSIS — E7800 Pure hypercholesterolemia, unspecified: Secondary | ICD-10-CM | POA: Diagnosis not present

## 2021-03-31 DIAGNOSIS — I2510 Atherosclerotic heart disease of native coronary artery without angina pectoris: Secondary | ICD-10-CM | POA: Diagnosis not present

## 2021-03-31 DIAGNOSIS — H409 Unspecified glaucoma: Secondary | ICD-10-CM | POA: Diagnosis not present

## 2021-04-05 DIAGNOSIS — H401131 Primary open-angle glaucoma, bilateral, mild stage: Secondary | ICD-10-CM | POA: Diagnosis not present

## 2021-04-05 DIAGNOSIS — Z961 Presence of intraocular lens: Secondary | ICD-10-CM | POA: Diagnosis not present

## 2021-05-06 ENCOUNTER — Ambulatory Visit: Payer: Medicare Other | Admitting: Cardiology

## 2021-05-13 DIAGNOSIS — H26492 Other secondary cataract, left eye: Secondary | ICD-10-CM | POA: Diagnosis not present

## 2021-05-13 DIAGNOSIS — H401131 Primary open-angle glaucoma, bilateral, mild stage: Secondary | ICD-10-CM | POA: Diagnosis not present

## 2021-07-13 ENCOUNTER — Other Ambulatory Visit: Payer: Self-pay | Admitting: Cardiology

## 2021-08-02 DIAGNOSIS — I10 Essential (primary) hypertension: Secondary | ICD-10-CM | POA: Diagnosis not present

## 2021-08-02 DIAGNOSIS — N1831 Chronic kidney disease, stage 3a: Secondary | ICD-10-CM | POA: Diagnosis not present

## 2021-08-02 DIAGNOSIS — I2510 Atherosclerotic heart disease of native coronary artery without angina pectoris: Secondary | ICD-10-CM | POA: Diagnosis not present

## 2021-08-02 DIAGNOSIS — E7800 Pure hypercholesterolemia, unspecified: Secondary | ICD-10-CM | POA: Diagnosis not present

## 2021-08-02 DIAGNOSIS — R739 Hyperglycemia, unspecified: Secondary | ICD-10-CM | POA: Diagnosis not present

## 2021-08-26 ENCOUNTER — Other Ambulatory Visit: Payer: Self-pay | Admitting: Cardiology

## 2021-09-16 DIAGNOSIS — H26493 Other secondary cataract, bilateral: Secondary | ICD-10-CM | POA: Diagnosis not present

## 2021-09-16 DIAGNOSIS — H401131 Primary open-angle glaucoma, bilateral, mild stage: Secondary | ICD-10-CM | POA: Diagnosis not present

## 2021-09-16 DIAGNOSIS — Z961 Presence of intraocular lens: Secondary | ICD-10-CM | POA: Diagnosis not present

## 2021-09-24 ENCOUNTER — Other Ambulatory Visit: Payer: Self-pay | Admitting: Cardiology

## 2021-09-24 DIAGNOSIS — I10 Essential (primary) hypertension: Secondary | ICD-10-CM

## 2021-09-24 DIAGNOSIS — R42 Dizziness and giddiness: Secondary | ICD-10-CM

## 2021-09-25 ENCOUNTER — Other Ambulatory Visit: Payer: Self-pay | Admitting: Cardiology

## 2022-01-17 ENCOUNTER — Other Ambulatory Visit: Payer: Self-pay | Admitting: Cardiology

## 2022-01-17 DIAGNOSIS — I10 Essential (primary) hypertension: Secondary | ICD-10-CM

## 2022-02-18 ENCOUNTER — Other Ambulatory Visit: Payer: Self-pay | Admitting: Cardiology

## 2022-02-21 ENCOUNTER — Encounter: Payer: Self-pay | Admitting: Cardiology

## 2022-02-21 ENCOUNTER — Ambulatory Visit: Payer: Medicare Other | Admitting: Cardiology

## 2022-02-21 VITALS — BP 102/75 | HR 59 | Temp 97.4°F | Resp 16 | Ht 68.0 in | Wt 196.0 lb

## 2022-02-21 DIAGNOSIS — I2510 Atherosclerotic heart disease of native coronary artery without angina pectoris: Secondary | ICD-10-CM

## 2022-02-21 DIAGNOSIS — Z95820 Peripheral vascular angioplasty status with implants and grafts: Secondary | ICD-10-CM

## 2022-02-21 DIAGNOSIS — E785 Hyperlipidemia, unspecified: Secondary | ICD-10-CM

## 2022-02-21 DIAGNOSIS — I10 Essential (primary) hypertension: Secondary | ICD-10-CM

## 2022-02-21 NOTE — Progress Notes (Signed)
 Bradley Parker Date of Birth: 07/08/1950 MRN: [id number] Primary Care Provider:Wong, Francis P, MD (Inactive)   Date: 02/21/22 Last Office Visit: 03/02/2021  Chief Complaint  Patient presents with   Coronary Artery Disease   Follow-up    HPI  Bradley Parker is a 71 y.o.  male whose past medical history and cardiovascular risk factors include: Benign essential hypertension, established coronary artery disease status post angioplasty and stent, preglaucoma, vitamin D deficiency, obesity, advanced age.  Patient has known history of angioplasty /stent to the mid LAD as of September 2020.  Since last office visit he has not had any reoccurrence of precordial discomfort, heart failure symptoms, near-syncope or syncope.  He is doing well on current medical therapy.  His overall functional status remains relatively stable.   ALLERGIES: No Known Allergies   MEDICATION LIST PRIOR TO VISIT: Current Outpatient Medications on File Prior to Visit  Medication Sig Dispense Refill   aspirin EC 81 MG tablet Take 81 mg by mouth daily.      brimonidine-timolol (COMBIGAN) 0.2-0.5 % ophthalmic solution Place 1 drop into both eyes every 12 (twelve) hours.     cetirizine (ZYRTEC) 10 MG tablet Take 10 mg by mouth daily as needed for allergies.     clopidogrel (PLAVIX) 75 MG tablet TAKE 1 TABLET BY MOUTH EVERY DAY 90 tablet 2   dutasteride (AVODART) 0.5 MG capsule Take 0.5 mg by mouth daily.      hydrochlorothiazide (MICROZIDE) 12.5 MG capsule TAKE 1 CAPSULE BY MOUTH IN THE MORNING. 90 capsule 3   losartan (COZAAR) 100 MG tablet TAKE 1 TABLET BY MOUTH EVERY DAY IN THE EVENING 90 tablet 1   metoprolol succinate (TOPROL-XL) 25 MG 24 hr tablet TAKE 1 TABLET BY MOUTH EVERY DAY 90 tablet 1   nitroGLYCERIN (NITROSTAT) 0.4 MG SL tablet Place 1 tablet (0.4 mg total) under the tongue every 5 (five) minutes as needed for up to 25 days for chest pain. 25 tablet 3   rosuvastatin (CRESTOR) 20 MG tablet TAKE 1 TABLET  BY MOUTH EVERY DAY 90 tablet 1   vitamin C (ASCORBIC ACID) 500 MG tablet Take 500 mg by mouth daily.     Vitamin D, Cholecalciferol, 25 MCG (1000 UT) TABS Take 1,000 Units by mouth daily.      No current facility-administered medications on file prior to visit.    PAST MEDICAL HISTORY: Past Medical History:  Diagnosis Date   Coronary artery disease    HTN (hypertension) 01/04/2019   Hyperlipidemia     PAST SURGICAL HISTORY: Past Surgical History:  Procedure Laterality Date   CARDIAC CATHETERIZATION     CATARACT EXTRACTION Left 01/29/2019   CORONARY ANGIOPLASTY WITH STENT PLACEMENT     CORONARY STENT INTERVENTION N/A 03/12/2019   Procedure: CORONARY STENT INTERVENTION;  Surgeon: Ganji, Jay, MD;  Location: [facility];  Service: Cardiovascular;  Laterality: N/A;   HERNIA REPAIR     LEFT HEART CATH AND CORONARY ANGIOGRAPHY N/A 03/12/2019   Procedure: LEFT HEART CATH AND CORONARY ANGIOGRAPHY;  Surgeon: Ganji, Jay, MD;  Location: [facility];  Service: Cardiovascular;  Laterality: N/A;    FAMILY HISTORY: The patient's family history includes Diabetes in his mother; Hypertension in his father and mother.   SOCIAL HISTORY:  The patient  reports that he has never smoked. He has never used smokeless tobacco. He reports that he does not drink alcohol and does not use drugs.  Review of Systems  Cardiovascular:  Negative for chest   pain, claudication, dyspnea on exertion, irregular heartbeat, leg swelling, near-syncope, orthopnea, palpitations, paroxysmal nocturnal dyspnea and syncope.  Respiratory:  Negative for shortness of breath.   Hematologic/Lymphatic: Negative for bleeding problem.  Musculoskeletal:  Negative for muscle cramps and myalgias.  Neurological:  Negative for dizziness and light-headedness.    PHYSICAL EXAM:    02/21/2022   10:02 AM 03/02/2021   10:47 AM 01/27/2021   10:03 AM  Vitals with BMI  Height 5' 8" 5' 8" 5' 8"  Weight 196 lbs 195 lbs 193 lbs 10 oz   BMI 29.81 29.66 29.44  Systolic 102 110 112  Diastolic 75 78 79  Pulse 59 63 72   Physical Exam  Constitutional: No distress.  Age appropriate, hemodynamically stable.   Neck: No JVD present.  Cardiovascular: Normal rate, regular rhythm, S1 normal, S2 normal and intact distal pulses. Exam reveals no gallop, no S3 and no S4.  No murmur heard. Pulses:      Dorsalis pedis pulses are 2+ on the right side and 2+ on the left side.       Posterior tibial pulses are 2+ on the right side and 2+ on the left side.  Pulmonary/Chest: Effort normal and breath sounds normal. No stridor. He has no wheezes. He has no rales.  Abdominal: Soft. Bowel sounds are normal. He exhibits no distension. There is no abdominal tenderness.  Musculoskeletal:        General: No edema.     Cervical back: Neck supple.  Neurological: He is alert and oriented to person, place, and time. He has intact cranial nerves (2-12).  Skin: Skin is warm and moist.   CARDIAC DATABASE: EKG: 02/21/2022: Sinus bradycardia, 55 bpm, nonspecific T wave abnormality.  Echocardiogram: 01/23/2019: LVEF 58%, moderate concentric LVH, grade 1 diastolic impairment, mild MR, mild TR, no evidence of pulmonary hypertension.  Stress Testing:  Exercise nuclear stress test 02/15/2021:  Normal ECG stress. The patient exercised for 5 minutes and 59 seconds of a Bruce protocol, achieving approximately 7.05 METs. Resting EKG demonstrated normal sinus rhythm. Peak EKG revealed no ST-T wave abnormalities. Recovery EKG revealed non-specific T-wave abnormalities. Normal BP response. Exercise capacity is reduced.  Mild soft tissue attenuation in the inferior wall, a small sized apical inferior ischemia cannot be completely excluded. Overall LV systolic function is normal without regional wall motion abnormalities. Stress LV EF: 58%.  No previous exam available for comparison. Low risk.   Heart Catheterization: 03/12/19: Normal LVEF. Normal LVEDP. Ost LM  lesion is 15% stenosed.  Noraml RCA and RI and RCA. Mid LAD lesion is 95% stenosedS/P BALLOON WOLVERINE 3.50X10 followed by STENT RESOLUTE ONYX 3.5X18. Maximum pressure: 12 atm. 95% to 0%.   LABORATORY DATA:    Latest Ref Rng & Units 03/01/2019    8:33 AM  CBC  WBC 3.4 - 10.8 x10E3/uL 5.5   Hemoglobin 13.0 - 17.7 g/dL 13.5   Hematocrit 37.5 - 51.0 % 42.2   Platelets 150 - 450 x10E3/uL 131        Latest Ref Rng & Units 11/11/2019    8:55 AM 06/13/2019    9:00 AM 03/01/2019    8:35 AM  CMP  Glucose 65 - 99 mg/dL 113  108  117   BUN 8 - 27 mg/dL 15  18  19   Creatinine 0.76 - 1.27 mg/dL 1.38  1.37  1.41   Sodium 134 - 144 mmol/L 141  142  142   Potassium 3.5 - 5.2 mmol/L 4.3    4.7  4.5   Chloride 96 - 106 mmol/L 104  104  106   CO2 20 - 29 mmol/L _0 Calcium 8.6 - 10.2 mg/dL 9.8  9.6  9.6   Total Protein 6.0 - 8.5 g/dL  7.2    Total Bilirubin 0.0 - 1.2 mg/dL  0.4    Alkaline Phos 39 - 117 IU/L  83    AST 0 - 40 IU/L  20    ALT 0 - 44 IU/L  30      Lipid Panel     Component Value Date/Time   CHOL 118 06/13/2019 0900   TRIG 58 06/13/2019 0900   HDL 46 06/13/2019 0900   CHOLHDL 2.6 06/13/2019 0900   LDLCALC 59 06/13/2019 0900   LABVLDL 13 06/13/2019 0900    No results found for: "HGBA1C" No components found for: "NTPROBNP" No results found for: "TSH"  Cardiac Panel (last 3 results) No results for input(s): "CKTOTAL", "CKMB", "TROPONINIHS", "RELINDX" in the last 72 hours.  FINAL MEDICATION LIST END OF ENCOUNTER: No orders of the defined types were placed in this encounter.    There are no discontinued medications.     Current Outpatient Medications:    aspirin EC 81 MG tablet, Take 81 mg by mouth daily. , Disp: , Rfl:    brimonidine-timolol (COMBIGAN) 0.2-0.5 % ophthalmic solution, Place 1 drop into both eyes every 12 (twelve) hours., Disp: , Rfl:    cetirizine (ZYRTEC) 10 MG tablet, Take 10 mg by mouth daily as needed for allergies., Disp: , Rfl:     clopidogrel (PLAVIX) 75 MG tablet, TAKE 1 TABLET BY MOUTH EVERY DAY, Disp: 90 tablet, Rfl: 2   dutasteride (AVODART) 0.5 MG capsule, Take 0.5 mg by mouth daily. , Disp: , Rfl:    hydrochlorothiazide (MICROZIDE) 12.5 MG capsule, TAKE 1 CAPSULE BY MOUTH IN THE MORNING., Disp: 90 capsule, Rfl: 3   losartan (COZAAR) 100 MG tablet, TAKE 1 TABLET BY MOUTH EVERY DAY IN THE EVENING, Disp: 90 tablet, Rfl: 1   metoprolol succinate (TOPROL-XL) 25 MG 24 hr tablet, TAKE 1 TABLET BY MOUTH EVERY DAY, Disp: 90 tablet, Rfl: 1   nitroGLYCERIN (NITROSTAT) 0.4 MG SL tablet, Place 1 tablet (0.4 mg total) under the tongue every 5 (five) minutes as needed for up to 25 days for chest pain., Disp: 25 tablet, Rfl: 3   rosuvastatin (CRESTOR) 20 MG tablet, TAKE 1 TABLET BY MOUTH EVERY DAY, Disp: 90 tablet, Rfl: 1   vitamin C (ASCORBIC ACID) 500 MG tablet, Take 500 mg by mouth daily., Disp: , Rfl:    Vitamin D, Cholecalciferol, 25 MCG (1000 UT) TABS, Take 1,000 Units by mouth daily. , Disp: , Rfl:   IMPRESSION:    ICD-10-CM   1. Atherosclerosis of native coronary artery of native heart without angina pectoris  I25.10 EKG 12-Lead    2. Status post angioplasty with stent  Z95.820     3. Essential hypertension  I10     4. Mild hyperlipidemia  E78.5         RECOMMENDATIONS: Joshiah Traynham is a 71 y.o. male whose past medical history and cardiovascular risk factors include: Benign essential hypertension, established coronary artery disease status post angioplasty and stent, preglaucoma, vitamin D deficiency, obesity, advanced age.  Atherosclerosis of native coronary artery of native heart without angina pectoris History of angioplasty/stent to the mid LAD back in September 2020. Remains dual antiplatelet therapy.  Shared decision was to stop  aspirin 81 mg p.o. daily and to continue Plavix for secondary prevention.  Denies angina pectoris or heart failure symptoms. EKG illustrates sinus bradycardia with similar ST-T  changes from prior. We emphasized the importance of secondary prevention and improving his risk factors including but not limited to hypertension and hyperlipidemia. He had labs with PCP in January 2023 as part of his yearly physical we will obtain copy for reference. Plan echo prior to the next office visit.  Essential hypertension Office blood pressures are very well controlled. Medications reconciled.  Mild hyperlipidemia Currently on list for statin.   He denies myalgia or other side effects. Lipids were last checked in January 2023 as part of his yearly physical.  Will obtain records for reference. Recommend a goal LDL <70 mg/dL.  Currently managed by primary care provider.  Orders Placed This Encounter  Procedures   EKG 12-Lead     --Continue cardiac medications as reconciled in final medication list. --No follow-ups on file. Or sooner if needed. --Continue follow-up with your primary care physician regarding the management of your other chronic comorbid conditions.  Patient's questions and concerns were addressed to his satisfaction. He voices understanding of the instructions provided during this encounter.   This note was created using a voice recognition software as a result there may be grammatical errors inadvertently enclosed that do not reflect the nature of this encounter. Every attempt is made to correct such errors.  Rex Kras, [place], [facility]  Pager: [phone] Office: [phone]

## 2022-03-20 ENCOUNTER — Other Ambulatory Visit: Payer: Self-pay | Admitting: Cardiology

## 2022-03-20 DIAGNOSIS — I10 Essential (primary) hypertension: Secondary | ICD-10-CM

## 2022-03-20 DIAGNOSIS — R42 Dizziness and giddiness: Secondary | ICD-10-CM

## 2022-04-07 ENCOUNTER — Other Ambulatory Visit: Payer: Self-pay | Admitting: Cardiology

## 2022-04-11 NOTE — Progress Notes (Signed)
External Labs: Collected: 02/03/2022 provided by primary team. BUN 17, creatinine 1.41. Sodium 141, potassium 4, chloride 105, bicarb 31  Collected: 08/02/2021 A1c 5. Hemoglobin 13.7 g/dL, hematocrit 42.3% Platelets 128,000 BUN 20, creatinine 1.4 Total cholesterol 98, triglycerides 52, HDL 43, calculated LDL 42, non-HDL 55  Outside labs reviewed.  Kenlei Safi [place], DO, [facility]

## 2022-04-14 DIAGNOSIS — H401131 Primary open-angle glaucoma, bilateral, mild stage: Secondary | ICD-10-CM | POA: Diagnosis not present

## 2022-08-03 DIAGNOSIS — I2510 Atherosclerotic heart disease of native coronary artery without angina pectoris: Secondary | ICD-10-CM | POA: Diagnosis not present

## 2022-08-03 DIAGNOSIS — I10 Essential (primary) hypertension: Secondary | ICD-10-CM | POA: Diagnosis not present

## 2022-08-03 DIAGNOSIS — D696 Thrombocytopenia, unspecified: Secondary | ICD-10-CM | POA: Diagnosis not present

## 2022-08-03 DIAGNOSIS — E7800 Pure hypercholesterolemia, unspecified: Secondary | ICD-10-CM | POA: Diagnosis not present

## 2022-08-03 DIAGNOSIS — Z95820 Peripheral vascular angioplasty status with implants and grafts: Secondary | ICD-10-CM | POA: Diagnosis not present

## 2022-08-03 DIAGNOSIS — N1831 Chronic kidney disease, stage 3a: Secondary | ICD-10-CM | POA: Diagnosis not present

## 2022-08-21 ENCOUNTER — Other Ambulatory Visit: Payer: Self-pay | Admitting: Cardiology

## 2022-08-29 DIAGNOSIS — Z961 Presence of intraocular lens: Secondary | ICD-10-CM | POA: Diagnosis not present

## 2022-08-29 DIAGNOSIS — H401131 Primary open-angle glaucoma, bilateral, mild stage: Secondary | ICD-10-CM | POA: Diagnosis not present

## 2022-09-13 ENCOUNTER — Other Ambulatory Visit: Payer: Self-pay | Admitting: Cardiology

## 2022-09-13 DIAGNOSIS — I10 Essential (primary) hypertension: Secondary | ICD-10-CM

## 2022-09-13 DIAGNOSIS — R42 Dizziness and giddiness: Secondary | ICD-10-CM

## 2022-09-15 DIAGNOSIS — Z789 Other specified health status: Secondary | ICD-10-CM | POA: Diagnosis not present

## 2022-09-15 DIAGNOSIS — L2089 Other atopic dermatitis: Secondary | ICD-10-CM | POA: Diagnosis not present

## 2022-09-15 DIAGNOSIS — L728 Other follicular cysts of the skin and subcutaneous tissue: Secondary | ICD-10-CM | POA: Diagnosis not present

## 2022-10-17 DIAGNOSIS — L728 Other follicular cysts of the skin and subcutaneous tissue: Secondary | ICD-10-CM | POA: Diagnosis not present

## 2022-10-17 DIAGNOSIS — L249 Irritant contact dermatitis, unspecified cause: Secondary | ICD-10-CM | POA: Diagnosis not present

## 2022-11-24 DIAGNOSIS — R0789 Other chest pain: Secondary | ICD-10-CM | POA: Diagnosis not present

## 2022-11-28 ENCOUNTER — Ambulatory Visit: Payer: Medicare Other | Admitting: Cardiology

## 2022-11-28 ENCOUNTER — Encounter: Payer: Self-pay | Admitting: Cardiology

## 2022-11-28 VITALS — BP 118/80 | HR 61 | Resp 16 | Ht 68.0 in | Wt 193.4 lb

## 2022-11-28 DIAGNOSIS — R072 Precordial pain: Secondary | ICD-10-CM

## 2022-11-28 DIAGNOSIS — I10 Essential (primary) hypertension: Secondary | ICD-10-CM

## 2022-11-28 DIAGNOSIS — E785 Hyperlipidemia, unspecified: Secondary | ICD-10-CM

## 2022-11-28 DIAGNOSIS — I25118 Atherosclerotic heart disease of native coronary artery with other forms of angina pectoris: Secondary | ICD-10-CM

## 2022-11-28 DIAGNOSIS — Z95820 Peripheral vascular angioplasty status with implants and grafts: Secondary | ICD-10-CM

## 2022-11-28 MED ORDER — NITROGLYCERIN 0.4 MG SL SUBL: 0.4000 mg | SUBLINGUAL_TABLET | SUBLINGUAL | 3 refills | Status: AC | PRN

## 2022-11-28 NOTE — Progress Notes (Deleted)
Bradley Parker Date of Birth: 02-23-1951 MRN: [id number] Primary Care Provider:Wong, Maryla Morrow, MD (Inactive)   Date: 11/28/22 Last Office Visit: 03/02/2021  No chief complaint on file.   HPI  Bradley Parker is a 72 y.o.  male whose past medical history and cardiovascular risk factors include: Benign essential hypertension, established coronary artery disease status post angioplasty and stent, preglaucoma, vitamin D deficiency, obesity, advanced age.  Patient has known history of angioplasty /stent to the mid LAD as of September 2020.  Since last office visit he has not had any reoccurrence of precordial discomfort, heart failure symptoms, near-syncope or syncope.  He is doing well on current medical therapy.  His overall functional status remains relatively stable.   ALLERGIES: No Known Allergies   MEDICATION LIST PRIOR TO VISIT: Current Outpatient Medications on File Prior to Visit  Medication Sig Dispense Refill   brimonidine-timolol (COMBIGAN) 0.2-0.5 % ophthalmic solution Place 1 drop into both eyes every 12 (twelve) hours.     cetirizine (ZYRTEC) 10 MG tablet Take 10 mg by mouth daily as needed for allergies.     clopidogrel (PLAVIX) 75 MG tablet TAKE 1 TABLET BY MOUTH EVERY DAY 90 tablet 2   dutasteride (AVODART) 0.5 MG capsule Take 0.5 mg by mouth daily.      hydrochlorothiazide (MICROZIDE) 12.5 MG capsule TAKE 1 CAPSULE BY MOUTH IN THE MORNING. 90 capsule 3   losartan (COZAAR) 100 MG tablet TAKE 1 TABLET BY MOUTH EVERY DAY IN THE EVENING 90 tablet 1   metoprolol succinate (TOPROL-XL) 25 MG 24 hr tablet TAKE 1 TABLET BY MOUTH EVERY DAY 90 tablet 1   nitroGLYCERIN (NITROSTAT) 0.4 MG SL tablet Place 1 tablet (0.4 mg total) under the tongue every 5 (five) minutes as needed for up to 25 days for chest pain. 25 tablet 3   rosuvastatin (CRESTOR) 20 MG tablet TAKE 1 TABLET BY MOUTH EVERY DAY 90 tablet 1   vitamin C (ASCORBIC ACID) 500 MG tablet Take 500 mg by mouth daily.      Vitamin D, Cholecalciferol, 25 MCG (1000 UT) TABS Take 1,000 Units by mouth daily.      No current facility-administered medications on file prior to visit.    PAST MEDICAL HISTORY: Past Medical History:  Diagnosis Date   Coronary artery disease    HTN (hypertension) 01/04/2019   Hyperlipidemia     PAST SURGICAL HISTORY: Past Surgical History:  Procedure Laterality Date   CARDIAC CATHETERIZATION     CATARACT EXTRACTION Left 01/29/2019   CORONARY ANGIOPLASTY WITH STENT PLACEMENT     CORONARY STENT INTERVENTION N/A 03/12/2019   Procedure: CORONARY STENT INTERVENTION;  Surgeon: Yates Decamp, MD;  Location: [facility];  Service: Cardiovascular;  Laterality: N/A;   HERNIA REPAIR     LEFT HEART CATH AND CORONARY ANGIOGRAPHY N/A 03/12/2019   Procedure: LEFT HEART CATH AND CORONARY ANGIOGRAPHY;  Surgeon: Yates Decamp, MD;  Location: [facility];  Service: Cardiovascular;  Laterality: N/A;    FAMILY HISTORY: The patient's family history includes Diabetes in his mother; Hypertension in his father and mother.   SOCIAL HISTORY:  The patient  reports that he has never smoked. He has never used smokeless tobacco. He reports that he does not drink alcohol and does not use drugs.  Review of Systems  Cardiovascular:  Negative for chest pain, claudication, dyspnea on exertion, irregular heartbeat, leg swelling, near-syncope, orthopnea, palpitations, paroxysmal nocturnal dyspnea and syncope.  Respiratory:  Negative for shortness of breath.  Hematologic/Lymphatic: Negative for bleeding problem.  Musculoskeletal:  Negative for muscle cramps and myalgias.  Neurological:  Negative for dizziness and light-headedness.    PHYSICAL EXAM:    02/21/2022   10:02 AM 03/02/2021   10:47 AM 01/27/2021   10:03 AM  Vitals with BMI  Height 5\' 8"  5\' 8"  5\' 8"   Weight 196 lbs 195 lbs 193 lbs 10 oz  BMI 29.81 29.66 29.44  Systolic 102 110 161  Diastolic 75 78 79  Pulse 59 63 72   Physical Exam   Constitutional: No distress.  Age appropriate, hemodynamically stable.   Neck: No JVD present.  Cardiovascular: Normal rate, regular rhythm, S1 normal, S2 normal and intact distal pulses. Exam reveals no gallop, no S3 and no S4.  No murmur heard. Pulses:      Dorsalis pedis pulses are 2+ on the right side and 2+ on the left side.       Posterior tibial pulses are 2+ on the right side and 2+ on the left side.  Pulmonary/Chest: Effort normal and breath sounds normal. No stridor. He has no wheezes. He has no rales.  Abdominal: Soft. Bowel sounds are normal. He exhibits no distension. There is no abdominal tenderness.  Musculoskeletal:        General: No edema.     Cervical back: Neck supple.  Neurological: He is alert and oriented to person, place, and time. He has intact cranial nerves (2-12).  Skin: Skin is warm and moist.   CARDIAC DATABASE: EKG:   *** 02/21/2022: Sinus bradycardia, 55 bpm, nonspecific T wave abnormality.  Echocardiogram: 01/23/2019: LVEF 58%, moderate concentric LVH, grade 1 diastolic impairment, mild MR, mild TR, no evidence of pulmonary hypertension.  Stress Testing:  Exercise nuclear stress test 02/15/2021:  Normal ECG stress. The patient exercised for 5 minutes and 59 seconds of a Bruce protocol, achieving approximately 7.05 METs. Resting EKG demonstrated normal sinus rhythm. Peak EKG revealed no ST-T wave abnormalities. Recovery EKG revealed non-specific T-wave abnormalities. Normal BP response. Exercise capacity is reduced.  Mild soft tissue attenuation in the inferior wall, a small sized apical inferior ischemia cannot be completely excluded. Overall LV systolic function is normal without regional wall motion abnormalities. Stress LV EF: 58%.  No previous exam available for comparison. Low risk.  Compared to Lexiscan nuclear stress test 10/18/2019, no change in perfusion study.  Heart Catheterization:   03/12/19: Normal LVEF. Normal LVEDP. Ost LM lesion is  15% stenosed.  Normal RCA and RI and RCA. Mid LAD lesion is 95% stenosedS/P BALLOON WOLVERINE 3.50X10 followed by STENT RESOLUTE ONYX 3.5X18. Maximum pressure: 12 atm. 95% to 0%.   LABORATORY DATA:    Latest Ref Rng & Units 03/01/2019    8:33 AM  CBC  WBC 3.4 - 10.8 x10E3/uL 5.5   Hemoglobin 13.0 - 17.7 g/dL 09.6   Hematocrit 04.5 - 51.0 % 42.2   Platelets 150 - 450 x10E3/uL 131        Latest Ref Rng & Units 11/11/2019    8:55 AM 06/13/2019    9:00 AM 03/01/2019    8:35 AM  CMP  Glucose 65 - 99 mg/dL 409  811  914   BUN 8 - 27 mg/dL 15  18  19    Creatinine 0.76 - 1.27 mg/dL 7.82  9.56  2.13   Sodium 134 - 144 mmol/L 141  142  142   Potassium 3.5 - 5.2 mmol/L 4.3  4.7  4.5   Chloride 96 - 106 mmol/L  104  104  106   CO2 20 - 29 mmol/L 22  25  23    Calcium 8.6 - 10.2 mg/dL 9.8  9.6  9.6   Total Protein 6.0 - 8.5 g/dL  7.2    Total Bilirubin 0.0 - 1.2 mg/dL  0.4    Alkaline Phos 39 - 117 IU/L  83    AST 0 - 40 IU/L  20    ALT 0 - 44 IU/L  30      Lipid Panel     Component Value Date/Time   CHOL 118 06/13/2019 0900   TRIG 58 06/13/2019 0900   HDL 46 06/13/2019 0900   CHOLHDL 2.6 06/13/2019 0900   LDLCALC 59 06/13/2019 0900   LABVLDL 13 06/13/2019 0900   External Labs: Collected: 02/03/2022 provided by primary team. BUN 17, creatinine 1.41. Sodium 141, potassium 4, chloride 105, bicarb 31  Collected: 08/02/2021 A1c 5. Hemoglobin 13.7 g/dL, hematocrit 16.1% Platelets 128,000 BUN 20, creatinine 1.4 Total cholesterol 98, triglycerides 52, HDL 43, calculated LDL 42, non-HDL 55  No Known Allergies    Current Outpatient Medications:    brimonidine-timolol (COMBIGAN) 0.2-0.5 % ophthalmic solution, Place 1 drop into both eyes every 12 (twelve) hours., Disp: , Rfl:    cetirizine (ZYRTEC) 10 MG tablet, Take 10 mg by mouth daily as needed for allergies., Disp: , Rfl:    clopidogrel (PLAVIX) 75 MG tablet, TAKE 1 TABLET BY MOUTH EVERY DAY, Disp: 90 tablet, Rfl: 2   dutasteride  (AVODART) 0.5 MG capsule, Take 0.5 mg by mouth daily. , Disp: , Rfl:    hydrochlorothiazide (MICROZIDE) 12.5 MG capsule, TAKE 1 CAPSULE BY MOUTH IN THE MORNING., Disp: 90 capsule, Rfl: 3   losartan (COZAAR) 100 MG tablet, TAKE 1 TABLET BY MOUTH EVERY DAY IN THE EVENING, Disp: 90 tablet, Rfl: 1   metoprolol succinate (TOPROL-XL) 25 MG 24 hr tablet, TAKE 1 TABLET BY MOUTH EVERY DAY, Disp: 90 tablet, Rfl: 1   nitroGLYCERIN (NITROSTAT) 0.4 MG SL tablet, Place 1 tablet (0.4 mg total) under the tongue every 5 (five) minutes as needed for up to 25 days for chest pain., Disp: 25 tablet, Rfl: 3   rosuvastatin (CRESTOR) 20 MG tablet, TAKE 1 TABLET BY MOUTH EVERY DAY, Disp: 90 tablet, Rfl: 1   vitamin C (ASCORBIC ACID) 500 MG tablet, Take 500 mg by mouth daily., Disp: , Rfl:    Vitamin D, Cholecalciferol, 25 MCG (1000 UT) TABS, Take 1,000 Units by mouth daily. , Disp: , Rfl:   IMPRESSION:    ICD-10-CM   1. Coronary artery disease of native artery of native heart with stable angina pectoris (HCC)  I25.118     2. Essential hypertension  I10     3. Mild hyperlipidemia  E78.5      RECOMMENDATIONS: Torrence Mcnear is a 72 y.o. male whose past medical history and cardiovascular risk factors include: Benign essential hypertension, established coronary artery disease status post angioplasty and stent to the mid LAD on 03/12/2019, preglaucoma, vitamin D deficiency, obesity, advanced age.  ***

## 2022-11-28 NOTE — Progress Notes (Signed)
Bradley Parker Date of Birth: [birth year] MRN: [id number] Primary Care Provider:Wong, Maryla Morrow, MD (Inactive)   Date: 11/28/22 Last Office Visit: 02/21/2022  Chief Complaint  Patient presents with   Atherosclerosis of native coronary artery of native heart w   Follow-up    HPI  Bradley Parker is a 72 y.o.  male whose past medical history and cardiovascular risk factors include: Benign essential hypertension, established coronary artery disease status post angioplasty and stent, preglaucoma, vitamin D deficiency, obesity, advanced age.  Patient has known history of angioplasty /stent to the mid LAD as of September 2020.  Patient presents today for a sick visit given recent chest pain episode.  Last week on 11/24/2022 patient was in his yard doing his weekly mowing, edging.  Substernal discomfort. Intensity 3 out of 10. Describes it as sharp/tightness/pressure. No improving factors. More noticeable with deep breath. Self-limited after 30 minutes. No use of sublingual nitroglycerin tablets. Did go to urgent care and he will open overall visit was unremarkable, per patient. Similar discomfort back in 2020 when he had the mid LAD stent. No reoccurrence of chest pain since 11/24/2022. No recent musculoskeletal injury and/or discomfort that may be contributory.  ALLERGIES: No Known Allergies   MEDICATION LIST PRIOR TO VISIT: Current Outpatient Medications on File Prior to Visit  Medication Sig Dispense Refill   brimonidine-timolol (COMBIGAN) 0.2-0.5 % ophthalmic solution Place 1 drop into both eyes every 12 (twelve) hours.     cetirizine (ZYRTEC) 10 MG tablet Take 10 mg by mouth daily as needed for allergies.     clopidogrel (PLAVIX) 75 MG tablet TAKE 1 TABLET BY MOUTH EVERY DAY 90 tablet 2   dutasteride (AVODART) 0.5 MG capsule Take 0.5 mg by mouth daily.      hydrochlorothiazide (MICROZIDE) 12.5 MG capsule TAKE 1 CAPSULE BY MOUTH IN THE MORNING. 90 capsule 3   losartan  (COZAAR) 100 MG tablet TAKE 1 TABLET BY MOUTH EVERY DAY IN THE EVENING 90 tablet 1   metoprolol succinate (TOPROL-XL) 25 MG 24 hr tablet TAKE 1 TABLET BY MOUTH EVERY DAY 90 tablet 1   rosuvastatin (CRESTOR) 20 MG tablet TAKE 1 TABLET BY MOUTH EVERY DAY 90 tablet 1   Vitamin D, Cholecalciferol, 25 MCG (1000 UT) TABS Take 1,000 Units by mouth daily.      vitamin C (ASCORBIC ACID) 500 MG tablet Take 500 mg by mouth daily. (Patient not taking: Reported on 11/28/2022)     No current facility-administered medications on file prior to visit.    PAST MEDICAL HISTORY: Past Medical History:  Diagnosis Date   Coronary artery disease    HTN (hypertension) 01/04/2019   Hyperlipidemia     PAST SURGICAL HISTORY: Past Surgical History:  Procedure Laterality Date   CARDIAC CATHETERIZATION     CATARACT EXTRACTION Left 01/29/2019   CORONARY ANGIOPLASTY WITH STENT PLACEMENT     CORONARY STENT INTERVENTION N/A 03/12/2019   Procedure: CORONARY STENT INTERVENTION;  Surgeon: Yates Decamp, MD;  Location: [facility];  Service: Cardiovascular;  Laterality: N/A;   HERNIA REPAIR     LEFT HEART CATH AND CORONARY ANGIOGRAPHY N/A 03/12/2019   Procedure: LEFT HEART CATH AND CORONARY ANGIOGRAPHY;  Surgeon: Yates Decamp, MD;  Location: [facility];  Service: Cardiovascular;  Laterality: N/A;    FAMILY HISTORY: The patient's family history includes Diabetes in his mother; Hypertension in his father and mother.   SOCIAL HISTORY:  The patient  reports that he has never smoked. He has never  used smokeless tobacco. He reports that he does not drink alcohol and does not use drugs.  Review of Systems  Cardiovascular:  Positive for chest pain (see HPI). Negative for claudication, dyspnea on exertion, irregular heartbeat, leg swelling, near-syncope, orthopnea, palpitations, paroxysmal nocturnal dyspnea and syncope.  Respiratory:  Negative for shortness of breath.   Hematologic/Lymphatic: Negative for bleeding  problem.  Musculoskeletal:  Negative for muscle cramps and myalgias.  Neurological:  Negative for dizziness and light-headedness.    PHYSICAL EXAM:    11/28/2022   10:26 AM 02/21/2022   10:02 AM 03/02/2021   10:47 AM  Vitals with BMI  Height 5\' 8"  5\' 8"  5\' 8"   Weight 193 lbs 6 oz 196 lbs 195 lbs  BMI 29.41 29.81 29.66  Systolic 118 102 478  Diastolic 80 75 78  Pulse 61 59 63   Physical Exam  Constitutional: No distress.  Age appropriate, hemodynamically stable.   Neck: No JVD present.  Cardiovascular: Normal rate, regular rhythm, S1 normal, S2 normal and intact distal pulses. Exam reveals no gallop, no S3 and no S4.  No murmur heard. Pulses:      Dorsalis pedis pulses are 2+ on the right side and 2+ on the left side.       Posterior tibial pulses are 2+ on the right side and 2+ on the left side.  Pulmonary/Chest: Effort normal and breath sounds normal. No stridor. He has no wheezes. He has no rales.  Abdominal: Soft. Bowel sounds are normal. He exhibits no distension. There is no abdominal tenderness.  Musculoskeletal:        General: No edema.     Cervical back: Neck supple.  Neurological: He is alert and oriented to person, place, and time. He has intact cranial nerves (2-12).  Skin: Skin is warm and moist.   CARDIAC DATABASE: EKG: November 28, 2022: Sinus bradycardia, 56 bpm, nonspecific T wave abnormality., without underlying injury pattern.  No significant change compared to 02/21/2022.  Echocardiogram: 01/23/2019: LVEF 58%, moderate concentric LVH, grade 1 diastolic impairment, mild MR, mild TR, no evidence of pulmonary hypertension.  Stress Testing:  Exercise nuclear stress test 02/15/2021:  Normal ECG stress. The patient exercised for 5 minutes and 59 seconds of a Bruce protocol, achieving approximately 7.05 METs. Resting EKG demonstrated normal sinus rhythm. Peak EKG revealed no ST-T wave abnormalities. Recovery EKG revealed non-specific T-wave abnormalities. Normal BP  response. Exercise capacity is reduced.  Mild soft tissue attenuation in the inferior wall, a small sized apical inferior ischemia cannot be completely excluded. Overall LV systolic function is normal without regional wall motion abnormalities. Stress LV EF: 58%.  No previous exam available for comparison. Low risk.   Heart Catheterization: 03/12/19: Normal LVEF. Normal LVEDP. Ost LM lesion is 15% stenosed.  Noraml RCA and RI and RCA. Mid LAD lesion is 95% stenosedS/P BALLOON WOLVERINE 3.50X10 followed by STENT RESOLUTE ONYX 3.5X18. Maximum pressure: 12 atm. 95% to 0%.   LABORATORY DATA:    Latest Ref Rng & Units 03/01/2019    8:33 AM  CBC  WBC 3.4 - 10.8 x10E3/uL 5.5   Hemoglobin 13.0 - 17.7 g/dL 29.5   Hematocrit 62.1 - 51.0 % 42.2   Platelets 150 - 450 x10E3/uL 131        Latest Ref Rng & Units 11/11/2019    8:55 AM 06/13/2019    9:00 AM 03/01/2019    8:35 AM  CMP  Glucose 65 - 99 mg/dL 308  657  846  BUN 8 - 27 mg/dL 15  18  19    Creatinine 0.76 - 1.27 mg/dL 1.61  0.96  0.45   Sodium 134 - 144 mmol/L 141  142  142   Potassium 3.5 - 5.2 mmol/L 4.3  4.7  4.5   Chloride 96 - 106 mmol/L 104  104  106   CO2 20 - 29 mmol/L 22  25  23    Calcium 8.6 - 10.2 mg/dL 9.8  9.6  9.6   Total Protein 6.0 - 8.5 g/dL  7.2    Total Bilirubin 0.0 - 1.2 mg/dL  0.4    Alkaline Phos 39 - 117 IU/L  83    AST 0 - 40 IU/L  20    ALT 0 - 44 IU/L  30      Lipid Panel     Component Value Date/Time   CHOL 118 06/13/2019 0900   TRIG 58 06/13/2019 0900   HDL 46 06/13/2019 0900   CHOLHDL 2.6 06/13/2019 0900   LDLCALC 59 06/13/2019 0900   LABVLDL 13 06/13/2019 0900    No results found for: "HGBA1C" No components found for: "NTPROBNP" No results found for: "TSH"   External Labs: Collected: 02/03/2022 provided by primary team. BUN 17, creatinine 1.41. Sodium 141, potassium 4, chloride 105, bicarb 31   Collected: 08/02/2021 A1c 5. Hemoglobin 13.7 g/dL, hematocrit 40.9% Platelets 128,000 BUN  20, creatinine 1.4 Total cholesterol 98, triglycerides 52, HDL 43, calculated LDL 42, non-HDL 55   FINAL MEDICATION LIST END OF ENCOUNTER: Meds ordered this encounter  Medications   nitroGLYCERIN (NITROSTAT) 0.4 MG SL tablet    Sig: Place 1 tablet (0.4 mg total) under the tongue every 5 (five) minutes as needed for up to 25 days for chest pain.    Dispense:  25 tablet    Refill:  3     Medications Discontinued During This Encounter  Medication Reason   nitroGLYCERIN (NITROSTAT) 0.4 MG SL tablet Reorder       Current Outpatient Medications:    brimonidine-timolol (COMBIGAN) 0.2-0.5 % ophthalmic solution, Place 1 drop into both eyes every 12 (twelve) hours., Disp: , Rfl:    cetirizine (ZYRTEC) 10 MG tablet, Take 10 mg by mouth daily as needed for allergies., Disp: , Rfl:    clopidogrel (PLAVIX) 75 MG tablet, TAKE 1 TABLET BY MOUTH EVERY DAY, Disp: 90 tablet, Rfl: 2   dutasteride (AVODART) 0.5 MG capsule, Take 0.5 mg by mouth daily. , Disp: , Rfl:    hydrochlorothiazide (MICROZIDE) 12.5 MG capsule, TAKE 1 CAPSULE BY MOUTH IN THE MORNING., Disp: 90 capsule, Rfl: 3   losartan (COZAAR) 100 MG tablet, TAKE 1 TABLET BY MOUTH EVERY DAY IN THE EVENING, Disp: 90 tablet, Rfl: 1   metoprolol succinate (TOPROL-XL) 25 MG 24 hr tablet, TAKE 1 TABLET BY MOUTH EVERY DAY, Disp: 90 tablet, Rfl: 1   rosuvastatin (CRESTOR) 20 MG tablet, TAKE 1 TABLET BY MOUTH EVERY DAY, Disp: 90 tablet, Rfl: 1   Vitamin D, Cholecalciferol, 25 MCG (1000 UT) TABS, Take 1,000 Units by mouth daily. , Disp: , Rfl:    nitroGLYCERIN (NITROSTAT) 0.4 MG SL tablet, Place 1 tablet (0.4 mg total) under the tongue every 5 (five) minutes as needed for up to 25 days for chest pain., Disp: 25 tablet, Rfl: 3   vitamin C (ASCORBIC ACID) 500 MG tablet, Take 500 mg by mouth daily. (Patient not taking: Reported on 11/28/2022), Disp: , Rfl:   IMPRESSION:    ICD-10-CM  1. Precordial pain  R07.2 EKG 12-Lead    nitroGLYCERIN (NITROSTAT) 0.4 MG  SL tablet    PCV MYOCARDIAL PERFUSION WO LEXISCAN    2. 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 Status post angioplasty with stent  Z95.820     4. Essential hypertension  I10     5. Mild hyperlipidemia  E78.5         RECOMMENDATIONS: Jolly Thammavongsa is a 72 y.o. male whose past medical history and cardiovascular risk factors include: Benign essential hypertension, established coronary artery disease status post angioplasty and stent, preglaucoma, vitamin D deficiency, obesity, advanced age.  Precordial pain Atherosclerosis of native coronary artery of native heart with other form of angina pectoris (HCC) Status post angioplasty with stent Precordial discomfort with mixed cardiac and noncardiac features. Given the fact that the symptoms were similar to his event in 2020 shared decision was to proceed with additional workup. Will move up the echocardiogram from August 2024 to the next available to reevaluate LVEF. Exercise nuclear stress test to evaluate functional capacity and reversible ischemia. Discussed risks, benefits, and alternatives and stress test as well as coronary CTA and shared decision was to proceed with stress MPI. Sublingual nitroglycerin tablets refilled.  Recommended holding metoprolol 2 days prior to his exercise nuclear stress test EKG: Sinus rhythm without injury pattern No reoccurrence of chest pain since 11/24/2022. Urgent care records provided by the patient reviewed and EKG scanned in the media section.  Essential hypertension Office blood pressures are well-controlled. Continue current medical therapy  Mild hyperlipidemia Currently on rosuvastatin.   He denies myalgia or other side effects. Most recent lipids dated February 2023, independently reviewed as noted above. He recently had labs in February  2024 which we provided the next visit Currently managed by primary care provider.  Orders Placed This Encounter  Procedures   PCV MYOCARDIAL PERFUSION WO LEXISCAN   EKG 12-Lead     --Continue cardiac medications as reconciled in final medication list. --Return in about 4 weeks (around 12/26/2022) for Reevaluation of chest pain, discuss stress test results. Or sooner if needed. --Continue follow-up with your primary care physician regarding the management of your other chronic comorbid conditions.  Patient's questions and concerns were addressed to his satisfaction. He voices understanding of the instructions provided during this encounter.   This note was created using a voice recognition software as a result there may be grammatical errors inadvertently enclosed that do not reflect the nature of this encounter. Every attempt is made to correct such errors.  Tessa Lerner, [place], [facility]  Pager:  [phone] Office: [phone]

## 2022-12-05 ENCOUNTER — Ambulatory Visit: Payer: Medicare Other

## 2022-12-05 DIAGNOSIS — R072 Precordial pain: Secondary | ICD-10-CM | POA: Diagnosis not present

## 2022-12-05 DIAGNOSIS — I25118 Atherosclerotic heart disease of native coronary artery with other forms of angina pectoris: Secondary | ICD-10-CM

## 2022-12-21 ENCOUNTER — Other Ambulatory Visit: Payer: Self-pay

## 2022-12-21 ENCOUNTER — Ambulatory Visit: Payer: Medicare Other

## 2022-12-21 DIAGNOSIS — I2510 Atherosclerotic heart disease of native coronary artery without angina pectoris: Secondary | ICD-10-CM

## 2022-12-21 DIAGNOSIS — Z95820 Peripheral vascular angioplasty status with implants and grafts: Secondary | ICD-10-CM

## 2022-12-27 ENCOUNTER — Encounter: Payer: Self-pay | Admitting: Cardiology

## 2022-12-27 ENCOUNTER — Ambulatory Visit: Payer: Medicare Other | Admitting: Cardiology

## 2022-12-27 VITALS — BP 113/79 | HR 73 | Ht 68.0 in | Wt 188.0 lb

## 2022-12-27 DIAGNOSIS — I77810 Thoracic aortic ectasia: Secondary | ICD-10-CM | POA: Diagnosis not present

## 2022-12-27 DIAGNOSIS — I10 Essential (primary) hypertension: Secondary | ICD-10-CM

## 2022-12-27 DIAGNOSIS — I2510 Atherosclerotic heart disease of native coronary artery without angina pectoris: Secondary | ICD-10-CM

## 2022-12-27 DIAGNOSIS — Z95820 Peripheral vascular angioplasty status with implants and grafts: Secondary | ICD-10-CM | POA: Diagnosis not present

## 2022-12-27 DIAGNOSIS — E785 Hyperlipidemia, unspecified: Secondary | ICD-10-CM

## 2022-12-27 NOTE — Progress Notes (Signed)
Bradley Parker Date of Birth: Apr 03, 1951 MRN: [id number] Primary Care Provider:Wharton, Rulon Eisenmenger   Date: 12/27/22 Last Office Visit: 11/28/2022  Chief Complaint  Patient presents with   Follow-up    Reevaluation of chest pain and discuss test results    HPI  Bradley Parker is a 72 y.o.  male whose past medical history and cardiovascular risk factors include: Benign essential hypertension, established coronary artery disease status post angioplasty and stent, preglaucoma, vitamin D deficiency, obesity, advanced age.  Patient has known history of angioplasty /stent to the mid LAD as of September 2020.  In June 2024 he presented for sooner office visit to discuss chest pain in the recent urgent care visit.  Given his symptoms, prior coronary interventions, the shared decision was to proceed with echocardiogram and stress test for restratification.  Since last office visit he has not had any reoccurrence of precordial discomfort, no use of sublingual nitroglycerin tablets.  Results of the echo and stress test reviewed with him in detail and noted below for further reference.  ALLERGIES: Allergies  Allergen Reactions   Sulfamethoxazole-Trimethoprim     Other Reaction(s): Dizziness, GI Intolerance     MEDICATION LIST PRIOR TO VISIT: Current Outpatient Medications on File Prior to Visit  Medication Sig Dispense Refill   brimonidine-timolol (COMBIGAN) 0.2-0.5 % ophthalmic solution Place 1 drop into both eyes every 12 (twelve) hours.     cetirizine (ZYRTEC) 10 MG tablet Take 10 mg by mouth daily as needed for allergies.     clopidogrel (PLAVIX) 75 MG tablet TAKE 1 TABLET BY MOUTH EVERY DAY 90 tablet 2   dutasteride (AVODART) 0.5 MG capsule Take 0.5 mg by mouth daily.      hydrochlorothiazide (MICROZIDE) 12.5 MG capsule TAKE 1 CAPSULE BY MOUTH IN THE MORNING. 90 capsule 3   losartan (COZAAR) 100 MG tablet TAKE 1 TABLET BY MOUTH EVERY DAY IN THE EVENING 90 tablet 1    metoprolol succinate (TOPROL-XL) 25 MG 24 hr tablet TAKE 1 TABLET BY MOUTH EVERY DAY 90 tablet 1   nitroGLYCERIN (NITROSTAT) 0.4 MG SL tablet Place 1 tablet (0.4 mg total) under the tongue every 5 (five) minutes as needed for up to 25 days for chest pain. 25 tablet 3   rosuvastatin (CRESTOR) 20 MG tablet TAKE 1 TABLET BY MOUTH EVERY DAY 90 tablet 1   vitamin C (ASCORBIC ACID) 500 MG tablet Take 500 mg by mouth daily.     Vitamin D, Cholecalciferol, 25 MCG (1000 UT) TABS Take 1,000 Units by mouth daily.      No current facility-administered medications on file prior to visit.    PAST MEDICAL HISTORY: Past Medical History:  Diagnosis Date   Coronary artery disease    HTN (hypertension) 01/04/2019   Hyperlipidemia     PAST SURGICAL HISTORY: Past Surgical History:  Procedure Laterality Date   CARDIAC CATHETERIZATION     CATARACT EXTRACTION Left 01/29/2019   CORONARY ANGIOPLASTY WITH STENT PLACEMENT     CORONARY STENT INTERVENTION N/A 03/12/2019   Procedure: CORONARY STENT INTERVENTION;  Surgeon: Yates Decamp, MD;  Location: [facility];  Service: Cardiovascular;  Laterality: N/A;   HERNIA REPAIR     LEFT HEART CATH AND CORONARY ANGIOGRAPHY N/A 03/12/2019   Procedure: LEFT HEART CATH AND CORONARY ANGIOGRAPHY;  Surgeon: Yates Decamp, MD;  Location: [facility];  Service: Cardiovascular;  Laterality: N/A;    FAMILY HISTORY: The patient's family history includes Diabetes in his mother; Hypertension in his father and  mother.   SOCIAL HISTORY:  The patient  reports that he has never smoked. He has never used smokeless tobacco. He reports that he does not drink alcohol and does not use drugs.  Review of Systems  Cardiovascular:  Negative for chest pain, claudication, dyspnea on exertion, irregular heartbeat, leg swelling, near-syncope, orthopnea, palpitations, paroxysmal nocturnal dyspnea and syncope.  Respiratory:  Negative for shortness of breath.   Hematologic/Lymphatic:  Negative for bleeding problem.  Musculoskeletal:  Negative for muscle cramps and myalgias.  Neurological:  Negative for dizziness and light-headedness.    PHYSICAL EXAM:    12/27/2022   11:02 AM 11/28/2022   10:26 AM 02/21/2022   10:02 AM  Vitals with BMI  Height 5\' 8"  5\' 8"  5\' 8"   Weight 188 lbs 193 lbs 6 oz 196 lbs  BMI 28.59 29.41 29.81  Systolic 113 118 865  Diastolic 79 80 75  Pulse 73 61 59   Physical Exam  Constitutional: No distress.  Age appropriate, hemodynamically stable.   Neck: No JVD present.  Cardiovascular: Normal rate, regular rhythm, S1 normal, S2 normal and intact distal pulses. Exam reveals no gallop, no S3 and no S4.  No murmur heard. Pulses:      Dorsalis pedis pulses are 2+ on the right side and 2+ on the left side.       Posterior tibial pulses are 2+ on the right side and 2+ on the left side.  Pulmonary/Chest: Effort normal and breath sounds normal. No stridor. He has no wheezes. He has no rales.  Abdominal: Soft. Bowel sounds are normal. He exhibits no distension. There is no abdominal tenderness.  Musculoskeletal:        General: No edema.     Cervical back: Neck supple.  Neurological: He is alert and oriented to person, place, and time. He has intact cranial nerves (2-12).  Skin: Skin is warm and moist.   CARDIAC DATABASE: EKG: November 28, 2022: Sinus bradycardia, 56 bpm, nonspecific T wave abnormality., without underlying injury pattern.  No significant change compared to 02/21/2022.  Echocardiogram: 01/23/2019: LVEF 58%, G1DD, see report for additional details.    12/21/2022: Normal LV systolic function with visual EF 60-65%. Left ventricle cavity is normal in size. Normal left ventricular wall thickness. Normal global wall motion. Normal diastolic filling pattern, normal LAP.Native tricuspid valve. No evidence of tricuspid stenosis. Mild tricuspid regurgitation. No evidence of pulmonary hypertension. The aortic root is normal. Proximal ascending aorta  dilatation, 42 mm. Compared to 7.29/2020 Grade 1 diastolic dysfunction is now normal, proximal ascending dilatation was not noted on prior study.   Stress Testing:  Exercise Myoview stress test 12/05/2022: Exercise nuclear stress test was performed using Bruce protocol.  1 Day Rest and Stress images. Exercise time 6 minutes 15 seconds, achieved 7.41 METS, 85% of APMHR.  Stress ECG negative for ischemia.  Small sized, mild intensity, fixed perfusion defect at apical cap likely secondary to artifact. Otherwise normal myocardial perfusion without prior infarct. Calculated LVEF 59%, left ventricular size is normal, thickness preserved, no obvious regional wall motion abnormalities. Prior exercise MPI August 2022 reported: Exercised 6 minutes, achieved 7.05 METS, normal myocardial perfusion with the exception of small sized apical inferior perfusion defect likely secondary to attenuation but ischemia cannot be excluded. Calculated LVEF 58%. Low risk study.   Heart Catheterization: 03/12/19: Normal LVEF. Normal LVEDP. Ost LM lesion is 15% stenosed.  Noraml RCA and RI and RCA. Mid LAD lesion is 95% stenosedS/P BALLOON WOLVERINE 3.50X10 followed by Francine Graven  RESOLUTE ONYX 3.5X18. Maximum pressure: 12 atm. 95% to 0%.   LABORATORY DATA:    Latest Ref Rng & Units 03/01/2019    8:33 AM  CBC  WBC 3.4 - 10.8 x10E3/uL 5.5   Hemoglobin 13.0 - 17.7 g/dL 16.1   Hematocrit 09.6 - 51.0 % 42.2   Platelets 150 - 450 x10E3/uL 131        Latest Ref Rng & Units 11/11/2019    8:55 AM 06/13/2019    9:00 AM 03/01/2019    8:35 AM  CMP  Glucose 65 - 99 mg/dL 045  409  811   BUN 8 - 27 mg/dL 15  18  19    Creatinine 0.76 - 1.27 mg/dL 9.14  7.82  9.56   Sodium 134 - 144 mmol/L 141  142  142   Potassium 3.5 - 5.2 mmol/L 4.3  4.7  4.5   Chloride 96 - 106 mmol/L 104  104  106   CO2 20 - 29 mmol/L 22  25  23    Calcium 8.6 - 10.2 mg/dL 9.8  9.6  9.6   Total Protein 6.0 - 8.5 g/dL  7.2    Total Bilirubin 0.0 - 1.2 mg/dL   0.4    Alkaline Phos 39 - 117 IU/L  83    AST 0 - 40 IU/L  20    ALT 0 - 44 IU/L  30      Lipid Panel     Component Value Date/Time   CHOL 118 06/13/2019 0900   TRIG 58 06/13/2019 0900   HDL 46 06/13/2019 0900   CHOLHDL 2.6 06/13/2019 0900   LDLCALC 59 06/13/2019 0900   LABVLDL 13 06/13/2019 0900    No results found for: "HGBA1C" No components found for: "NTPROBNP" No results found for: "TSH"   External Labs: Collected: 02/03/2022 provided by primary team. BUN 17, creatinine 1.41. Sodium 141, potassium 4, chloride 105, bicarb 31   Collected: 08/02/2021 A1c 5. Hemoglobin 13.7 g/dL, hematocrit 21.3% Platelets 128,000 BUN 20, creatinine 1.4 Total cholesterol 98, triglycerides 52, HDL 43, calculated LDL 42, non-HDL 55   FINAL MEDICATION LIST END OF ENCOUNTER: No orders of the defined types were placed in this encounter.    There are no discontinued medications.      Current Outpatient Medications:    brimonidine-timolol (COMBIGAN) 0.2-0.5 % ophthalmic solution, Place 1 drop into both eyes every 12 (twelve) hours., Disp: , Rfl:    cetirizine (ZYRTEC) 10 MG tablet, Take 10 mg by mouth daily as needed for allergies., Disp: , Rfl:    clopidogrel (PLAVIX) 75 MG tablet, TAKE 1 TABLET BY MOUTH EVERY DAY, Disp: 90 tablet, Rfl: 2   dutasteride (AVODART) 0.5 MG capsule, Take 0.5 mg by mouth daily. , Disp: , Rfl:    hydrochlorothiazide (MICROZIDE) 12.5 MG capsule, TAKE 1 CAPSULE BY MOUTH IN THE MORNING., Disp: 90 capsule, Rfl: 3   losartan (COZAAR) 100 MG tablet, TAKE 1 TABLET BY MOUTH EVERY DAY IN THE EVENING, Disp: 90 tablet, Rfl: 1   metoprolol succinate (TOPROL-XL) 25 MG 24 hr tablet, TAKE 1 TABLET BY MOUTH EVERY DAY, Disp: 90 tablet, Rfl: 1   nitroGLYCERIN (NITROSTAT) 0.4 MG SL tablet, Place 1 tablet (0.4 mg total) under the tongue every 5 (five) minutes as needed for up to 25 days for chest pain., Disp: 25 tablet, Rfl: 3   rosuvastatin (CRESTOR) 20 MG tablet, TAKE 1 TABLET BY  MOUTH EVERY DAY, Disp: 90 tablet, Rfl: 1   vitamin C (  ASCORBIC ACID) 500 MG tablet, Take 500 mg by mouth daily., Disp: , Rfl:    Vitamin D, Cholecalciferol, 25 MCG (1000 UT) TABS, Take 1,000 Units by mouth daily. , Disp: , Rfl:   IMPRESSION:    ICD-10-CM   1. Atherosclerosis of native coronary artery of native heart without angina pectoris  I25.10     2. Status post angioplasty with stent  Z95.820     3. Ascending aorta dilatation (HCC)  I77.810 CT CHEST WO CONTRAST    4. Essential hypertension  I10     5. Mild hyperlipidemia  E78.5         RECOMMENDATIONS: Bradley Parker is a 72 y.o. male whose past medical history and cardiovascular risk factors include: Benign essential hypertension, established coronary artery disease status post angioplasty and stent, preglaucoma, vitamin D deficiency, obesity, advanced age.  Atherosclerosis of native coronary artery of native heart without angina pectoris (HCC) Status post angioplasty with stent No reoccurrence of chest pain. Echocardiogram: Preserved LVEF, no significant valvular heart disease, proximal ascending aorta 42 mm (new finding). MPI: Low risk study. No use of sublingual nitroglycerin tablets since the last office visit. Reemphasized the importance of secondary prevention with focus on improving her modifiable cardiovascular risk factors such as glycemic control, lipid management, blood pressure control, weight loss.  Ascending aortic dilatation: Echo June 2024 illustrates proximal ascending aorta to be 42 mm. This is a new finding for the patient. Check CT of the chest without contrast to reevaluate aortic dimensions and to see if longitudinal follow-up as needed. No family history of aortic syndromes. We discussed symptoms are most consistent with aortic syndromes and if present should go to the closest ER via EMS for further evaluation and management. Reemphasizing went over blood pressure management-prior blood pressures have  been well-controlled on current medical therapy Monitor for now  Essential hypertension Office blood pressures are well-controlled. Continue current medical therapy No changes warranted at this time.  Mild hyperlipidemia Currently on rosuvastatin.   He denies myalgia or other side effects. Most recent lipids dated February 2023, independently reviewed as noted above. He recently had labs in February 2024 which we provided the next visit Currently managed by primary care provider.  Orders Placed This Encounter  Procedures   CT CHEST WO CONTRAST     --Continue cardiac medications as reconciled in final medication list. --Return in about 6 months (around 06/29/2023) for Follow up CAD - follow up CT chest w/o contrast. . Or sooner if needed. --Continue follow-up with your primary care physician regarding the management of your other chronic comorbid conditions.  Patient's questions and concerns were addressed to his satisfaction. He voices understanding of the instructions provided during this encounter.   This note was created using a voice recognition software as a result there may be grammatical errors inadvertently enclosed that do not reflect the nature of this encounter. Every attempt is made to correct such errors.  Tessa Lerner, [place], [facility]  Pager:  [phone] Office: [phone]

## 2023-01-07 ENCOUNTER — Other Ambulatory Visit: Payer: Self-pay | Admitting: Cardiology

## 2023-01-07 DIAGNOSIS — I10 Essential (primary) hypertension: Secondary | ICD-10-CM

## 2023-01-19 ENCOUNTER — Ambulatory Visit
Admission: RE | Admit: 2023-01-19 | Discharge: 2023-01-19 | Disposition: A | Discharge status: Home / Self Care | Payer: Medicare Other | Source: Ambulatory Visit | Attending: Cardiology | Admitting: Cardiology

## 2023-01-19 DIAGNOSIS — I77810 Thoracic aortic ectasia: Secondary | ICD-10-CM

## 2023-01-19 DIAGNOSIS — I700 Atherosclerosis of aorta: Secondary | ICD-10-CM | POA: Diagnosis not present

## 2023-01-19 DIAGNOSIS — I10 Essential (primary) hypertension: Secondary | ICD-10-CM | POA: Diagnosis not present

## 2023-01-19 DIAGNOSIS — I719 Aortic aneurysm of unspecified site, without rupture: Secondary | ICD-10-CM | POA: Diagnosis not present

## 2023-01-29 ENCOUNTER — Other Ambulatory Visit: Payer: Self-pay | Admitting: Cardiology

## 2023-01-29 DIAGNOSIS — I77810 Thoracic aortic ectasia: Secondary | ICD-10-CM

## 2023-02-01 NOTE — Progress Notes (Signed)
Called patient, Bradley Parker, Bradley Parker

## 2023-02-03 DIAGNOSIS — Z0000 Encounter for general adult medical examination without abnormal findings: Secondary | ICD-10-CM | POA: Diagnosis not present

## 2023-02-06 DIAGNOSIS — H401131 Primary open-angle glaucoma, bilateral, mild stage: Secondary | ICD-10-CM | POA: Diagnosis not present

## 2023-02-09 ENCOUNTER — Other Ambulatory Visit: Payer: Medicare Other

## 2023-02-12 ENCOUNTER — Other Ambulatory Visit: Payer: Self-pay | Admitting: Cardiology

## 2023-02-22 ENCOUNTER — Ambulatory Visit: Payer: Medicare Other | Admitting: Cardiology

## 2023-03-10 ENCOUNTER — Other Ambulatory Visit: Payer: Self-pay | Admitting: Cardiology

## 2023-03-10 DIAGNOSIS — I10 Essential (primary) hypertension: Secondary | ICD-10-CM

## 2023-03-10 DIAGNOSIS — R42 Dizziness and giddiness: Secondary | ICD-10-CM

## 2023-04-10 ENCOUNTER — Telehealth: Payer: Self-pay | Admitting: *Deleted

## 2023-04-10 NOTE — Telephone Encounter (Signed)
Left message to call back schedule tele pre op appt.

## 2023-04-10 NOTE — Telephone Encounter (Signed)
   Pre-operative Risk Assessment    Patient Name: Bradley Parker  DOB: [birth year] MRN: [id number]      Request for Surgical Clearance    Procedure:   Colonoscopy  Date of Surgery:  Clearance 04/27/23                                 Surgeon:  Dr. Charlott Rakes Surgeon's Group or Practice Name:  [facility] Phone number:  [phone] Fax number:  [phone]   Type of Clearance Requested:   - Medical  - Pharmacy:  Hold Clopidogrel (Plavix) Not Indicated.   Type of Anesthesia:   Propofol   Additional requests/questions:    Signed, Emmit Pomfret   04/10/2023, 9:39 AM

## 2023-04-10 NOTE — Telephone Encounter (Signed)
Primary Cardiologist:None   Preoperative team, please contact this patient and set up a phone call appointment for further preoperative risk assessment. Please obtain consent and complete medication review. Thank you for your help.   Per office protocol and pending no concerning cardiac symptoms at time of call, he may hold Plavix for 5 days prior to procedure and should resume as soon as hemodynamically stable postoperatively.  I also confirmed the patient resides in the state of [place]. As per [facility] Medical Board telemedicine laws, the patient must reside in the state in which the provider is licensed.   Levi Aland, NP-C  04/10/2023, 11:04 AM [address], [address] Office [phone] Fax [phone]

## 2023-04-12 ENCOUNTER — Telehealth: Payer: Self-pay | Admitting: *Deleted

## 2023-04-12 NOTE — Telephone Encounter (Signed)
2nd attempt to reach the pt to set up tele pre op appt. Ok per Jari Favre, [facility] to add pt on provider slot due to procedure date and med hold.   I will update the requesting office we have tried to reach the pt x 2 with no call back.

## 2023-04-12 NOTE — Telephone Encounter (Signed)
S/w the pt and he has been scheduled for tele pre op appt 04/17/23. Ok to add on per Jari Favre, [facility] due to med hold and procedure. Pt states also he would like to call the [phone] as his primary #. Pt asked what was the ph# we had down for his wife. I read off [phone], pt said the last digit was wrong. He said the last digit is 4; complete # for his wife is [phone]. I have corrected this in the chart for the pt.    Med rec and consent are done.

## 2023-04-12 NOTE — Telephone Encounter (Signed)
Patient is returning call. Please advise? 

## 2023-04-12 NOTE — Telephone Encounter (Signed)
S/w the pt and he has been scheduled for tele pre op appt 04/17/23. Ok to add on per Jari Favre, [facility] due to med hold and procedure. Pt states also he would like to call the [phone] as his primary #. Pt asked what was the ph# we had down for his wife. I read off [phone], pt said the last digit was wrong. He said the last digit is 4; complete # for his wife is [phone]. I have corrected this in the chart for the pt.   Med rec and consent are done.     Patient Consent for Virtual Visit        Bradley Parker has provided verbal consent on 04/12/2023 for a virtual visit (video or telephone).   CONSENT FOR VIRTUAL VISIT FOR:  Bradley Parker  By participating in this virtual visit I agree to the following:  I hereby voluntarily request, consent and authorize [facility] and its employed or contracted physicians, physician assistants, nurse practitioners or other licensed health care professionals (the Practitioner), to provide me with telemedicine health care services (the "Services") as deemed necessary by the treating Practitioner. I acknowledge and consent to receive the Services by the Practitioner via telemedicine. I understand that the telemedicine visit will involve communicating with the Practitioner through live audiovisual communication technology and the disclosure of certain medical information by electronic transmission. I acknowledge that I have been given the opportunity to request an in-person assessment or other available alternative prior to the telemedicine visit and am voluntarily participating in the telemedicine visit.  I understand that I have the right to withhold or withdraw my consent to the use of telemedicine in the course of my care at any time, without affecting my right to future care or treatment, and that the Practitioner or I may terminate the telemedicine visit at any time. I understand that I have the right to inspect all information obtained  and/or recorded in the course of the telemedicine visit and may receive copies of available information for a reasonable fee.  I understand that some of the potential risks of receiving the Services via telemedicine include:  Delay or interruption in medical evaluation due to technological equipment failure or disruption; Information transmitted may not be sufficient (e.g. poor resolution of images) to allow for appropriate medical decision making by the Practitioner; and/or  In rare instances, security protocols could fail, causing a breach of personal health information.  Furthermore, I acknowledge that it is my responsibility to provide information about my medical history, conditions and care that is complete and accurate to the best of my ability. I acknowledge that Practitioner's advice, recommendations, and/or decision may be based on factors not within their control, such as incomplete or inaccurate data provided by me or distortions of diagnostic images or specimens that may result from electronic transmissions. I understand that the practice of medicine is not an exact science and that Practitioner makes no warranties or guarantees regarding treatment outcomes. I acknowledge that a copy of this consent can be made available to me via my patient portal [facility] MyChart), or I can request a printed copy by calling the office of [facility].    I understand that my insurance will be billed for this visit.   I have read or had this consent read to me. I understand the contents of this consent, which adequately explains the benefits and risks of the Services being provided via telemedicine.  I have been provided ample opportunity  to ask questions regarding this consent and the Services and have had my questions answered to my satisfaction. I give my informed consent for the services to be provided through the use of telemedicine in my medical care

## 2023-04-17 ENCOUNTER — Ambulatory Visit: Payer: Medicare Other | Attending: Cardiology

## 2023-04-17 DIAGNOSIS — Z01810 Encounter for preprocedural cardiovascular examination: Secondary | ICD-10-CM | POA: Diagnosis not present

## 2023-04-17 NOTE — Progress Notes (Signed)
Virtual Visit via Telephone Note   Because of Bradley Parker's co-morbid illnesses, he is at least at moderate risk for complications without adequate follow up.  This format is felt to be most appropriate for this patient at this time.  The patient did not have access to video technology/had technical difficulties with video requiring transitioning to audio format only (telephone).  All issues noted in this document were discussed and addressed.  No physical exam could be performed with this format.  Please refer to the patient's chart for his consent to telehealth for [facility].  Evaluation Performed:  Preoperative cardiovascular risk assessment _____________   Date:  04/17/2023   Patient ID:  Bradley Parker, DOB 09-07-1950, MRN [id number] Patient Location:  Home Provider location:   Office  Primary Care Provider:  Jarrett Soho, PA-C Primary Cardiologist:  None  Chief Complaint / Patient Profile   72 y.o. y/o male with a h/o coronary artery disease, ascending aortic dilation, hyperlipidemia, hypertension who is pending colonoscopy and presents today for telephonic preoperative cardiovascular risk assessment.  History of Present Illness    Bradley Parker is a 72 y.o. male who presents via audio/video conferencing for a telehealth visit today.  Pt was last seen in cardiology clinic on 12/27/22 by Dr. Odis Hollingshead.  At that time Kervens Gregorio was doing well .  The patient is now pending procedure as outlined above. Since his last visit, he remains stable from a cardiac standpoint.  Today he denies chest pain, shortness of breath, lower extremity edema, fatigue, palpitations, melena, hematuria, hemoptysis, diaphoresis, weakness, presyncope, syncope, orthopnea, and PND.   Past Medical History    Past Medical History:  Diagnosis Date   Coronary artery disease    HTN (hypertension) 01/04/2019   Hyperlipidemia    Past Surgical History:  Procedure Laterality Date   CARDIAC  CATHETERIZATION     CATARACT EXTRACTION Left 01/29/2019   CORONARY ANGIOPLASTY WITH STENT PLACEMENT     CORONARY STENT INTERVENTION N/A 03/12/2019   Procedure: CORONARY STENT INTERVENTION;  Surgeon: Yates Decamp, MD;  Location: [facility];  Service: Cardiovascular;  Laterality: N/A;   HERNIA REPAIR     LEFT HEART CATH AND CORONARY ANGIOGRAPHY N/A 03/12/2019   Procedure: LEFT HEART CATH AND CORONARY ANGIOGRAPHY;  Surgeon: Yates Decamp, MD;  Location: [facility];  Service: Cardiovascular;  Laterality: N/A;    Allergies  Allergies  Allergen Reactions   Sulfamethoxazole-Trimethoprim     Other Reaction(s): Dizziness, GI Intolerance    Home Medications    Prior to Admission medications   Medication Sig Start Date End Date Taking? Authorizing Provider  brimonidine-timolol (COMBIGAN) 0.2-0.5 % ophthalmic solution Place 1 drop into both eyes every 12 (twelve) hours.    [provider]  cetirizine (ZYRTEC) 10 MG tablet Take 10 mg by mouth daily as needed for allergies.    [provider]  clopidogrel (PLAVIX) 75 MG tablet TAKE 1 TABLET BY MOUTH EVERY DAY 01/09/23   Tolia, Sunit, DO  dutasteride (AVODART) 0.5 MG capsule Take 0.5 mg by mouth daily.     [provider]  hydrochlorothiazide (MICROZIDE) 12.5 MG capsule TAKE 1 CAPSULE BY MOUTH EVERY MORNING 01/09/23   Tolia, Sunit, DO  losartan (COZAAR) 100 MG tablet TAKE 1 TABLET BY MOUTH EVERY DAY IN THE EVENING 03/10/23   Tolia, Sunit, DO  metoprolol succinate (TOPROL-XL) 25 MG 24 hr tablet TAKE 1 TABLET BY MOUTH EVERY DAY 03/10/23   Tolia, Sunit, DO  nitroGLYCERIN (NITROSTAT)  0.4 MG SL tablet Place 1 tablet (0.4 mg total) under the tongue every 5 (five) minutes as needed for up to 25 days for chest pain. 11/28/22 12/27/22  Tolia, Sunit, DO  rosuvastatin (CRESTOR) 20 MG tablet TAKE 1 TABLET BY MOUTH EVERY DAY 02/14/23   Tolia, Sunit, DO  vitamin C (ASCORBIC ACID) 500 MG tablet Take 500 mg by mouth daily.    [provider]  Vitamin D, Cholecalciferol, 25 MCG (1000 UT) TABS Take 1,000 Units by mouth daily.     [provider]    Physical Exam    Vital Signs:  Caeden Switzer does not have vital signs available for review today.  Given telephonic nature of communication, physical exam is limited. AAOx3. NAD. Normal affect.  Speech and respirations are unlabored.  Accessory Clinical Findings    None  Assessment & Plan    1.  Preoperative Cardiovascular Risk Assessment: Colonoscopy, Dr. Charlott Rakes, [facility], fax #[phone]      Primary Cardiologist: Dr. Odis Hollingshead  Chart reviewed as part of pre-operative protocol coverage. Given past medical history and time since last visit, based on ACC/AHA guidelines, Aldie Losito would be at acceptable risk for the planned procedure without further cardiovascular testing.   He may hold Plavix for 5 days prior to procedure and should resume as soon as hemodynamically stable postoperatively.   Patient was advised that if he/she develops new symptoms prior to surgery to contact our office to arrange a follow-up appointment.  He verbalized understanding.  I will route this recommendation to the requesting party via Epic fax function and remove from pre-op pool.       Time:   Today, I have spent  5 minutes with the patient with telehealth technology discussing medical history, symptoms, and management plan.Prior to patient's phone evaluation I spent greater than 10 minutes reviewing their past medical history and cardiac medications.      Ronney Asters, NP  04/17/2023, 8:35 AM

## 2023-04-27 DIAGNOSIS — Z1211 Encounter for screening for malignant neoplasm of colon: Secondary | ICD-10-CM | POA: Diagnosis not present

## 2023-04-27 DIAGNOSIS — K648 Other hemorrhoids: Secondary | ICD-10-CM | POA: Diagnosis not present

## 2023-04-27 DIAGNOSIS — K5730 Diverticulosis of large intestine without perforation or abscess without bleeding: Secondary | ICD-10-CM | POA: Diagnosis not present

## 2023-04-27 DIAGNOSIS — D124 Benign neoplasm of descending colon: Secondary | ICD-10-CM | POA: Diagnosis not present

## 2023-05-04 DIAGNOSIS — D124 Benign neoplasm of descending colon: Secondary | ICD-10-CM | POA: Diagnosis not present

## 2023-06-02 DIAGNOSIS — E7800 Pure hypercholesterolemia, unspecified: Secondary | ICD-10-CM | POA: Diagnosis not present

## 2023-06-02 DIAGNOSIS — I25119 Atherosclerotic heart disease of native coronary artery with unspecified angina pectoris: Secondary | ICD-10-CM | POA: Diagnosis not present

## 2023-06-02 DIAGNOSIS — I2510 Atherosclerotic heart disease of native coronary artery without angina pectoris: Secondary | ICD-10-CM | POA: Diagnosis not present

## 2023-06-02 DIAGNOSIS — I10 Essential (primary) hypertension: Secondary | ICD-10-CM | POA: Diagnosis not present

## 2023-06-02 DIAGNOSIS — D696 Thrombocytopenia, unspecified: Secondary | ICD-10-CM | POA: Diagnosis not present

## 2023-06-02 DIAGNOSIS — I77810 Thoracic aortic ectasia: Secondary | ICD-10-CM | POA: Diagnosis not present

## 2023-06-02 DIAGNOSIS — N1831 Chronic kidney disease, stage 3a: Secondary | ICD-10-CM | POA: Diagnosis not present

## 2023-06-02 DIAGNOSIS — Z95820 Peripheral vascular angioplasty status with implants and grafts: Secondary | ICD-10-CM | POA: Diagnosis not present

## 2023-06-02 LAB — LAB REPORT - SCANNED: EGFR: 51

## 2023-06-07 NOTE — Progress Notes (Signed)
External Labs: Collected: June 02, 2023 provided by PCP. BUN 19, creatinine 1.48. Potassium 4.4. Total cholesterol 111, triglycerides 47, HDL 48, calculated LDL 51, non-HDL 63.  We will review the results at the upcoming office visit.  Regards,   Trig Mcbryar [place], DO, [facility]

## 2023-06-29 ENCOUNTER — Ambulatory Visit: Payer: Medicare Other | Attending: Cardiology | Admitting: Cardiology

## 2023-06-29 ENCOUNTER — Encounter: Payer: Self-pay | Admitting: Cardiology

## 2023-06-29 VITALS — BP 118/80 | HR 58 | Resp 16 | Ht 68.0 in | Wt 189.0 lb

## 2023-06-29 DIAGNOSIS — I2510 Atherosclerotic heart disease of native coronary artery without angina pectoris: Secondary | ICD-10-CM | POA: Diagnosis not present

## 2023-06-29 DIAGNOSIS — I10 Essential (primary) hypertension: Secondary | ICD-10-CM | POA: Diagnosis not present

## 2023-06-29 DIAGNOSIS — I77810 Thoracic aortic ectasia: Secondary | ICD-10-CM

## 2023-06-29 DIAGNOSIS — Z95820 Peripheral vascular angioplasty status with implants and grafts: Secondary | ICD-10-CM

## 2023-06-29 DIAGNOSIS — E785 Hyperlipidemia, unspecified: Secondary | ICD-10-CM | POA: Diagnosis not present

## 2023-06-29 NOTE — Patient Instructions (Signed)
 Medication Instructions:  Your physician recommends that you continue on your current medications as directed. Please refer to the Current Medication list given to you today.  *If you need a refill on your cardiac medications before your next appointment, please call your pharmacy*  Lab Work: None ordered today. If you have labs (blood work) drawn today and your tests are completely normal, you will receive your results only by: MyChart Message (if you have MyChart) OR A paper copy in the mail If you have any lab test that is abnormal or we need to change your treatment, we will call you to review the results.  Testing/Procedures: Current chest CT will be pushed to August per Dr. Tyree request.  Follow-Up: At [facility], you and your health needs are our priority.  As part of our continuing mission to provide you with exceptional heart care, we have created designated Provider Care Teams.  These Care Teams include your primary Cardiologist (physician) and Advanced Practice Providers (APPs -  Physician Assistants and Nurse Practitioners) who all work together to provide you with the care you need, when you need it.   Your next appointment:   9 month(s)  The format for your next appointment:   In Person  Provider:   Madonna Large, DO {  Other Instructions An incidental finding of a thyroid nodule was found on your most recent CT scan. Please follow up with your primary care provider. A copy of the scan has been sent to your primary care provider's office already.

## 2023-06-29 NOTE — Progress Notes (Signed)
 Cardiology Office Note:  .   Date:  06/29/2023  ID:  Bradley Parker, DOB 09-09-1950, MRN [id number] PCP:  Katina Pfeiffer, PA-C  Former Cardiology Providers: Dr. Gordy Bergamo, [facility] Providers Cardiologist:  Madonna Large, DO , [facility] (established care April 2021) Electrophysiologist:  None  Click to update primary MD,subspecialty MD or APP then REFRESH:1}    Chief Complaint  Patient presents with   Atherosclerosis of native coronary artery of native heart w   Follow-up    History of Present Illness: .   Bradley Parker is a 73 y.o. African-American male whose past medical history and cardiovascular risk factors includes: Dilatation of the ascending aorta, benign essential hypertension, established coronary artery disease status post angioplasty and stent, preglaucoma, vitamin D deficiency, obesity, advanced age.   Patient being followed by the practice given his history of angioplasty/stent to the mid LAD as of September 2020.  Denies anginal chest pain or heart failure symptoms since last office visit.  Overall functional capacity also remains stable.  Home blood pressures are also well-controlled.  He had a CT of the chest in August 2024 which noted the ascending aorta to be 43 mm.  He had an incidental finding of a left thyroid nodule and was asked to follow-up with PCP for further evaluation and management.  However, this is still pending.  Review of Systems: .   Review of Systems  Cardiovascular:  Negative for chest pain, claudication, irregular heartbeat, leg swelling, near-syncope, orthopnea, palpitations, paroxysmal nocturnal dyspnea and syncope.  Respiratory:  Negative for shortness of breath.   Hematologic/Lymphatic: Negative for bleeding problem.    Studies Reviewed:   EKG: EKG Interpretation Date/Time:  Thursday June 29 2023 09:14:29 EST Ventricular Rate:  57 PR Interval:  172 QRS Duration:  108 QT Interval:  402 QTC Calculation: 391 R  Axis:   -4  Text Interpretation: Sinus bradycardia Nonspecific T wave abnormality When compared with ECG of 12-Mar-2019 13:25, No significant change since last tracing Confirmed by Large Madonna [phone]) on 06/29/2023 9:38:36 AM  Echocardiogram: 12/21/2022: Normal LV systolic function with visual EF 60-65%. Left ventricle cavity is normal in size. Normal left ventricular wall thickness. Normal global wall motion. Normal diastolic filling pattern, normal LAP.Native tricuspid valve. No evidence of tricuspid stenosis. Mild tricuspid regurgitation. No evidence of pulmonary hypertension. The aortic root is normal. Proximal ascending aorta dilatation, 42 mm. Compared to 7.29/2020 Grade 1 diastolic dysfunction is now normal, proximal ascending dilatation was not noted on prior study.   Stress Testing: Exercise nuclear stress test June 2024: Low risk study.  See report for additional details  Heart Catheterization: 03/12/19: Normal LVEF. Normal LVEDP. Ost LM lesion is 15% stenosed.  Noraml RCA and RI and RCA. Mid LAD lesion is 95% stenosedS/P BALLOON WOLVERINE 3.50X10 followed by STENT RESOLUTE ONYX 3.5X18. Maximum pressure: 12 atm. 95% to 0%.   RADIOLOGY: N/A  Risk Assessment/Calculations:   CT chest without contrast August 2024: Dilated ascending thoracic aorta to 4.3 cm. Recommend annual imaging followup by CTA or MRA. Minimal aortic atherosclerosis.  4.9 cm left thyroid nodule. Recommend thyroid US    Labs:   External Labs: Collected: 02/03/2022 provided by primary team. BUN 17, creatinine 1.41. Sodium 141, potassium 4, chloride 105, bicarb 31   Collected: 08/02/2021 A1c 5. Hemoglobin 13.7 g/dL, hematocrit 57.6% Platelets 128,000 serum-urine gonorrhea is RI good family is okay send BUN 20, creatinine 1.4 Total cholesterol 98, triglycerides 52, HDL 43, calculated LDL 42, non-HDL 55  Collected: June 02, 2023 provided by PCP. BUN 19, creatinine 1.48. Potassium 4.4. Total cholesterol  111, triglycerides 47, HDL 48, calculated LDL 51, non-HDL 63.  Physical Exam:    Today's Vitals   06/29/23 0911  BP: 118/80  Pulse: (!) 58  Resp: 16  SpO2: 98%  Weight: 189 lb (85.7 kg)  Height: 5' 8 (1.727 m)   Body mass index is 28.74 kg/m. Wt Readings from Last 3 Encounters:  06/29/23 189 lb (85.7 kg)  12/27/22 188 lb (85.3 kg)  11/28/22 193 lb 6.4 oz (87.7 kg)    Physical Exam  Constitutional: No distress.  hemodynamically stable  Neck: No JVD present.  Cardiovascular: Normal rate, regular rhythm, S1 normal and S2 normal. Exam reveals no gallop, no S3 and no S4.  No murmur heard. Pulmonary/Chest: Effort normal and breath sounds normal. No stridor. He has no wheezes. He has no rales.  Abdominal: Soft. Bowel sounds are normal. He exhibits no distension. There is no abdominal tenderness.  Musculoskeletal:        General: No edema.     Cervical back: Neck supple.  Neurological: He is alert and oriented to person, place, and time. He has intact cranial nerves (2-12).  Skin: Skin is warm.     Impression & Recommendation(s):  Impression:   ICD-10-CM   1. Atherosclerosis of native coronary artery of native heart without angina pectoris  I25.10 EKG 12-Lead    2. Status post angioplasty with stent  Z95.820     3. Ascending aorta dilatation (HCC)  I77.810     4. Essential hypertension  I10     5. Mild hyperlipidemia  E78.5        Recommendation(s):  Atherosclerosis of native coronary artery of native heart without angina pectoris Status post angioplasty with stent Denies anginal chest pain. EKG is nonischemic. No use of sublingual nitroglycerin  tablets since the last office visit. Recent echo and stress test in June 2024 reviewed as part of medical decision making today.  Ascending aorta dilatation (HCC) Incidentally noted on surface echocardiogram back in 2024. Last echocardiogram noted ascending aorta to be 42 mm. CT of the chest without contrast in July 2024  noted ascending aorta to be 43 mm. Blood pressures are very well-controlled on current medical therapy. Patient is cognizant of symptoms suggestive of aortic syndromes and if present should go to the closest ER via EMS.  Essential hypertension Office blood pressures are very well-controlled. Continue losartan  100 mg p.o. every afternoon. Continue Toprol -XL 25 mg p.o. every morning. Continue hydrochlorothiazide  12.5 mg p.o. every morning  Mild hyperlipidemia Currently on Crestor  20 mg p.o. daily.   He denies myalgia or other side effects. Most recent lipids dated December 2024, independently reviewed as noted above.  Patient is advised to follow-up with PCP to review the results of the CT from July 2024 which notes an incidental finding of a thyroid nodule.  Orders Placed:  Orders Placed This Encounter  Procedures   EKG 12-Lead   Discussed management of at least 2 chronic comorbid conditions, EKG ordered and independently reviewed, outside labs from December 2024 independently reviewed and noted above, CT of the chest without contrast from July 2024 reviewed as discussed above, and prior echo and stress test results reviewed during today's encounter with regards to medical decision making  Final Medication List:   No orders of the defined types were placed in this encounter.   There are no discontinued medications.   Current Outpatient Medications:    brimonidine-timolol (COMBIGAN)  0.2-0.5 % ophthalmic solution, Place 1 drop into both eyes every 12 (twelve) hours., Disp: , Rfl:    cetirizine (ZYRTEC) 10 MG tablet, Take 10 mg by mouth daily as needed for allergies., Disp: , Rfl:    clopidogrel  (PLAVIX ) 75 MG tablet, TAKE 1 TABLET BY MOUTH EVERY DAY, Disp: 90 tablet, Rfl: 2   dutasteride (AVODART) 0.5 MG capsule, Take 0.5 mg by mouth daily. , Disp: , Rfl:    hydrochlorothiazide  (MICROZIDE ) 12.5 MG capsule, TAKE 1 CAPSULE BY MOUTH EVERY MORNING, Disp: 90 capsule, Rfl: 3   losartan   (COZAAR ) 100 MG tablet, TAKE 1 TABLET BY MOUTH EVERY DAY IN THE EVENING, Disp: 90 tablet, Rfl: 1   metoprolol  succinate (TOPROL -XL) 25 MG 24 hr tablet, TAKE 1 TABLET BY MOUTH EVERY DAY, Disp: 90 tablet, Rfl: 1   nitroGLYCERIN  (NITROSTAT ) 0.4 MG SL tablet, Place 1 tablet (0.4 mg total) under the tongue every 5 (five) minutes as needed for up to 25 days for chest pain., Disp: 25 tablet, Rfl: 3   rosuvastatin  (CRESTOR ) 20 MG tablet, TAKE 1 TABLET BY MOUTH EVERY DAY, Disp: 90 tablet, Rfl: 1   vitamin C (ASCORBIC ACID) 500 MG tablet, Take 500 mg by mouth daily., Disp: , Rfl:    Vitamin D, Cholecalciferol, 25 MCG (1000 UT) TABS, Take 1,000 Units by mouth daily. , Disp: , Rfl:   Consent:   N/A  Disposition:   September / October 2025 after the CT of the chest Patient may be asked to follow-up sooner based on the results of the above-mentioned testing.  His questions and concerns were addressed to his satisfaction. He voices understanding of the recommendations provided during this encounter.    Signed, Madonna Large, DO, [facility]  [facility]  [address] [place], [place] [address] 06/29/2023 9:50 AM

## 2023-08-04 DIAGNOSIS — E042 Nontoxic multinodular goiter: Secondary | ICD-10-CM | POA: Diagnosis not present

## 2023-08-04 DIAGNOSIS — E041 Nontoxic single thyroid nodule: Secondary | ICD-10-CM | POA: Diagnosis not present

## 2023-08-07 DIAGNOSIS — H401131 Primary open-angle glaucoma, bilateral, mild stage: Secondary | ICD-10-CM | POA: Diagnosis not present

## 2023-08-07 DIAGNOSIS — H5212 Myopia, left eye: Secondary | ICD-10-CM | POA: Diagnosis not present

## 2023-08-07 DIAGNOSIS — Z961 Presence of intraocular lens: Secondary | ICD-10-CM | POA: Diagnosis not present

## 2023-08-07 DIAGNOSIS — H524 Presbyopia: Secondary | ICD-10-CM | POA: Diagnosis not present

## 2023-08-09 ENCOUNTER — Other Ambulatory Visit: Payer: Self-pay | Admitting: Cardiology

## 2023-09-06 ENCOUNTER — Other Ambulatory Visit: Payer: Self-pay | Admitting: Cardiology

## 2023-09-06 DIAGNOSIS — I10 Essential (primary) hypertension: Secondary | ICD-10-CM

## 2023-09-06 DIAGNOSIS — R42 Dizziness and giddiness: Secondary | ICD-10-CM

## 2023-10-04 ENCOUNTER — Other Ambulatory Visit: Payer: Self-pay

## 2023-10-04 MED ORDER — CLOPIDOGREL BISULFATE 75 MG PO TABS: 75.0000 mg | ORAL_TABLET | Freq: Every day | ORAL | 2 refills | Status: AC

## 2023-10-26 DIAGNOSIS — H401121 Primary open-angle glaucoma, left eye, mild stage: Secondary | ICD-10-CM | POA: Diagnosis not present

## 2023-11-08 DIAGNOSIS — H43812 Vitreous degeneration, left eye: Secondary | ICD-10-CM | POA: Diagnosis not present

## 2023-11-08 DIAGNOSIS — Z961 Presence of intraocular lens: Secondary | ICD-10-CM | POA: Diagnosis not present

## 2023-11-25 DIAGNOSIS — I2510 Atherosclerotic heart disease of native coronary artery without angina pectoris: Secondary | ICD-10-CM | POA: Diagnosis not present

## 2023-11-25 DIAGNOSIS — N1831 Chronic kidney disease, stage 3a: Secondary | ICD-10-CM | POA: Diagnosis not present

## 2023-11-25 DIAGNOSIS — I25119 Atherosclerotic heart disease of native coronary artery with unspecified angina pectoris: Secondary | ICD-10-CM | POA: Diagnosis not present

## 2023-12-04 DIAGNOSIS — N1831 Chronic kidney disease, stage 3a: Secondary | ICD-10-CM | POA: Diagnosis not present

## 2023-12-04 DIAGNOSIS — I10 Essential (primary) hypertension: Secondary | ICD-10-CM | POA: Diagnosis not present

## 2023-12-04 DIAGNOSIS — D696 Thrombocytopenia, unspecified: Secondary | ICD-10-CM | POA: Diagnosis not present

## 2023-12-04 DIAGNOSIS — I77810 Thoracic aortic ectasia: Secondary | ICD-10-CM | POA: Diagnosis not present

## 2023-12-04 DIAGNOSIS — I25119 Atherosclerotic heart disease of native coronary artery with unspecified angina pectoris: Secondary | ICD-10-CM | POA: Diagnosis not present

## 2023-12-04 DIAGNOSIS — I2510 Atherosclerotic heart disease of native coronary artery without angina pectoris: Secondary | ICD-10-CM | POA: Diagnosis not present

## 2023-12-04 DIAGNOSIS — Z95820 Peripheral vascular angioplasty status with implants and grafts: Secondary | ICD-10-CM | POA: Diagnosis not present

## 2023-12-04 DIAGNOSIS — E7800 Pure hypercholesterolemia, unspecified: Secondary | ICD-10-CM | POA: Diagnosis not present

## 2023-12-06 DIAGNOSIS — H401132 Primary open-angle glaucoma, bilateral, moderate stage: Secondary | ICD-10-CM | POA: Diagnosis not present

## 2023-12-06 DIAGNOSIS — H43812 Vitreous degeneration, left eye: Secondary | ICD-10-CM | POA: Diagnosis not present

## 2023-12-31 ENCOUNTER — Other Ambulatory Visit: Payer: Self-pay | Admitting: Cardiology

## 2023-12-31 DIAGNOSIS — I10 Essential (primary) hypertension: Secondary | ICD-10-CM

## 2024-01-26 ENCOUNTER — Ambulatory Visit (HOSPITAL_BASED_OUTPATIENT_CLINIC_OR_DEPARTMENT_OTHER)
Admission: RE | Admit: 2024-01-26 | Discharge: 2024-01-26 | Disposition: A | Discharge status: Home / Self Care | Payer: Medicare Other | Source: Ambulatory Visit | Attending: Cardiology | Admitting: Cardiology

## 2024-01-26 DIAGNOSIS — E041 Nontoxic single thyroid nodule: Secondary | ICD-10-CM | POA: Diagnosis not present

## 2024-01-26 DIAGNOSIS — I77810 Thoracic aortic ectasia: Secondary | ICD-10-CM | POA: Diagnosis not present

## 2024-01-26 DIAGNOSIS — I700 Atherosclerosis of aorta: Secondary | ICD-10-CM | POA: Diagnosis not present

## 2024-02-05 ENCOUNTER — Ambulatory Visit: Payer: Self-pay | Admitting: Cardiology

## 2024-02-05 DIAGNOSIS — I77810 Thoracic aortic ectasia: Secondary | ICD-10-CM

## 2024-02-08 DIAGNOSIS — Z0000 Encounter for general adult medical examination without abnormal findings: Secondary | ICD-10-CM | POA: Diagnosis not present

## 2024-02-08 DIAGNOSIS — I25119 Atherosclerotic heart disease of native coronary artery with unspecified angina pectoris: Secondary | ICD-10-CM | POA: Diagnosis not present

## 2024-02-08 DIAGNOSIS — H401131 Primary open-angle glaucoma, bilateral, mild stage: Secondary | ICD-10-CM | POA: Diagnosis not present

## 2024-02-08 DIAGNOSIS — B351 Tinea unguium: Secondary | ICD-10-CM | POA: Diagnosis not present

## 2024-02-08 DIAGNOSIS — I700 Atherosclerosis of aorta: Secondary | ICD-10-CM | POA: Diagnosis not present

## 2024-02-08 DIAGNOSIS — Z95820 Peripheral vascular angioplasty status with implants and grafts: Secondary | ICD-10-CM | POA: Diagnosis not present

## 2024-02-28 ENCOUNTER — Other Ambulatory Visit: Payer: Self-pay | Admitting: Cardiology

## 2024-02-28 DIAGNOSIS — R42 Dizziness and giddiness: Secondary | ICD-10-CM

## 2024-02-28 DIAGNOSIS — I10 Essential (primary) hypertension: Secondary | ICD-10-CM

## 2024-02-28 MED ORDER — LOSARTAN POTASSIUM 100 MG PO TABS: 100.0000 mg | ORAL_TABLET | Freq: Every evening | ORAL | 1 refills | Status: AC

## 2024-03-06 ENCOUNTER — Other Ambulatory Visit: Payer: Self-pay

## 2024-03-06 MED ORDER — METOPROLOL SUCCINATE ER 25 MG PO TB24: 25.0000 mg | ORAL_TABLET | Freq: Every day | ORAL | 1 refills | Status: AC

## 2024-07-10 ENCOUNTER — Other Ambulatory Visit: Payer: Self-pay | Admitting: Cardiology

## 2024-07-10 NOTE — Telephone Encounter (Signed)
 In accordance with refill protocols, please review and address the following requirements before this medication refill can be authorized:  Appointment  and Labs    Pending Appt 2/25 w Michele

## 2024-07-11 MED ORDER — ROSUVASTATIN CALCIUM 20 MG PO TABS: 20.0000 mg | ORAL_TABLET | Freq: Every day | ORAL | 1 refills | Status: AC

## 2024-08-21 ENCOUNTER — Ambulatory Visit: Admitting: Cardiology
